# Patient Record
Sex: Female | Born: 1975
Health system: Southern US, Community
[De-identification: ages and names within clinical notes are randomized; demographics above are authoritative.]

## PROBLEM LIST (undated history)

## (undated) DIAGNOSIS — G43909 Migraine, unspecified, not intractable, without status migrainosus: Secondary | ICD-10-CM

## (undated) DIAGNOSIS — R011 Cardiac murmur, unspecified: Secondary | ICD-10-CM

## (undated) DIAGNOSIS — J386 Stenosis of larynx: Secondary | ICD-10-CM

## (undated) DIAGNOSIS — C801 Malignant (primary) neoplasm, unspecified: Secondary | ICD-10-CM

## (undated) DIAGNOSIS — B019 Varicella without complication: Secondary | ICD-10-CM

## (undated) DIAGNOSIS — I8390 Asymptomatic varicose veins of unspecified lower extremity: Secondary | ICD-10-CM

## (undated) HISTORY — DX: Varicella without complication: B01.9

## (undated) HISTORY — DX: Malignant (primary) neoplasm, unspecified: C80.1

## (undated) HISTORY — DX: Migraine, unspecified, not intractable, without status migrainosus: G43.909

## (undated) HISTORY — DX: Stenosis of larynx: J38.6

## (undated) HISTORY — DX: Cardiac murmur, unspecified: R01.1

## (undated) HISTORY — PX: WISDOM TOOTH EXTRACTION: SHX21

## (undated) HISTORY — DX: Asymptomatic varicose veins of unspecified lower extremity: I83.90

---

## 1998-08-27 ENCOUNTER — Emergency Department (HOSPITAL_COMMUNITY): Admission: EM | Admit: 1998-08-27 | Discharge: 1998-08-27 | Payer: Self-pay | Admitting: Emergency Medicine

## 2004-03-11 ENCOUNTER — Other Ambulatory Visit: Admission: RE | Admit: 2004-03-11 | Discharge: 2004-03-11 | Payer: Self-pay | Admitting: Obstetrics & Gynecology

## 2005-04-21 ENCOUNTER — Other Ambulatory Visit: Admission: RE | Admit: 2005-04-21 | Discharge: 2005-04-21 | Payer: Self-pay | Admitting: Obstetrics & Gynecology

## 2012-12-31 ENCOUNTER — Ambulatory Visit: Payer: Self-pay | Admitting: Family Medicine

## 2013-01-04 ENCOUNTER — Ambulatory Visit (INDEPENDENT_AMBULATORY_CARE_PROVIDER_SITE_OTHER): Payer: Managed Care, Other (non HMO) | Admitting: Family Medicine

## 2013-01-04 ENCOUNTER — Telehealth: Payer: Self-pay | Admitting: Family Medicine

## 2013-01-04 ENCOUNTER — Encounter: Payer: Self-pay | Admitting: Family Medicine

## 2013-01-04 VITALS — BP 102/78 | HR 77 | Temp 98.5°F | Wt 212.0 lb

## 2013-01-04 DIAGNOSIS — Z23 Encounter for immunization: Secondary | ICD-10-CM

## 2013-01-04 DIAGNOSIS — N946 Dysmenorrhea, unspecified: Secondary | ICD-10-CM | POA: Insufficient documentation

## 2013-01-04 DIAGNOSIS — R011 Cardiac murmur, unspecified: Secondary | ICD-10-CM

## 2013-01-04 DIAGNOSIS — Z7189 Other specified counseling: Secondary | ICD-10-CM

## 2013-01-04 DIAGNOSIS — Z7689 Persons encountering health services in other specified circumstances: Secondary | ICD-10-CM

## 2013-01-04 LAB — LIPID PANEL
LDL Cholesterol: 64 mg/dL (ref 0–99)
Total CHOL/HDL Ratio: 3
VLDL: 9.2 mg/dL (ref 0.0–40.0)

## 2013-01-04 LAB — HEMOGLOBIN A1C: Hgb A1c MFr Bld: 5.3 % (ref 4.6–6.5)

## 2013-01-04 NOTE — Telephone Encounter (Signed)
Labs look good.

## 2013-01-04 NOTE — Addendum Note (Signed)
Addended by: Azucena Freed on: 01/04/2013 08:42 AM   Modules accepted: Orders

## 2013-01-04 NOTE — Progress Notes (Signed)
Chief Complaint  Patient presents with  . Establish Care    HPI:  Kayla Mccall is here to establish care. Has not had a family doctor in past - husband comes to this practice.  Last PCP and physical: sees doctor Arlyce Dice in gyn for physicals and pap smears - last physical 08/2012  Has the following chronic problems and concerns today:  Patient Active Problem List  Diagnosis  . Menstrual cramps - uses vicodin very sparingly once per month  . Murmur    Health Maintenance: -does not get flu vaccines, can't remember last tetanus  ROS: See pertinent positives and negatives per HPI. Denies CP, SOB, DOE, palpitations, GI changes, urinary symptoms, skin changes. Has pain in L lateral leg occ when walking. Has occ chest tightness when exercising - thinks just deconditioned.  Past Medical History  Diagnosis Date  . Chicken pox   . Migraines     Family History  Problem Relation Age of Onset  . Hyperlipidemia Mother   . Arthritis Maternal Grandmother   . Breast cancer      maternal great grandmother  . Diabetes Maternal Grandmother     History   Social History  . Marital Status: Unknown    Spouse Name: N/A    Number of Children: N/A  . Years of Education: N/A   Social History Main Topics  . Smoking status: Never Smoker   . Smokeless tobacco: None  . Alcohol Use: Yes     Comment: occ - 1-2 drinks rarely  . Drug Use: No  . Sexually Active: Yes     Comment: with spouse   Other Topics Concern  . 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 Only during menstrual cycle., Disp: , Rfl:   EXAM:  Filed Vitals:   01/04/13 0802  BP: 102/78  Pulse: 77  Temp: 98.5 F (36.9 C)    There is no height on file to calculate BMI.  GENERAL: vitals reviewed and listed above, alert, oriented, appears well hydrated and in no acute distress  HEENT: atraumatic, conjunttiva clear, no obvious abnormalities on inspection of external nose and ears  NECK: no obvious masses on inspection  LUNGS: clear to auscultation bilaterally, no wheezes, rales or rhonchi, good air movement  CV: HRRR, SEM, no peripheral edema  MS: moves all extremities without noticeable abnormality  -L foot/ankle with mild varus deformity  PSYCH: pleasant and cooperative, no obvious depression or anxiety  ASSESSMENT AND PLAN:  Discussed the following assessment and plan:  Encounter to establish care - Plan: Lipid Panel, Hemoglobin A1C  Menstrual cramps - uses vicodin very sparingly once per month  Murmur - Plan: 2D Echocardiogram without contrast  -We reviewed the PMH, PSH, FH, SH, Meds and Allergies. -murmur on exam pt reports not present in the past - sounds benign but pt worried -  will get echo -FASTING LABS today -advised shoes with reduced arch for foot and leg issues  -tdap today -follow up in 3 months or sooner pending labs/studies  -Patient advised to return or notify a doctor immediately if symptoms worsen or persist or new concerns arise.  Patient Instructions  -We have ordered labs or studies at this visit. It can take up to 1-2 weeks for results and processing. We will contact you with instructions IF your results are abnormal. Normal results will be released to your [facility]. If you have not heard from [place] or can not find your results in [facility] in 2 weeks please contact our office.  -PLEASE SIGN UP FOR MYCHART TODAY    -We placed a referral for you as discussed to get an ECHOCARDIOGRAM of your heart. It usually takes about 1-2 weeks to process and schedule this referral. If you have not heard from [place] regarding this appointment in 2 weeks please contact our office.  We recommend the following healthy lifestyle measures: - eat a healthy diet consisting of lots of vegetables, fruits, beans, nuts, seeds, healthy meats such as white chicken and fish and whole grains.  - avoid fried foods, fast food, processed foods, sodas, red meet and other fattening foods.  - get a least 150 minutes of aerobic exercise per week.   Follow up in: 3 months      KIM, HANNAH R.

## 2013-01-04 NOTE — Patient Instructions (Addendum)
-  We have ordered labs or studies at this visit. It can take up to 1-2 weeks for results and processing. We will contact you with instructions IF your results are abnormal. Normal results will be released to your [facility]. If you have not heard from [place] or can not find your results in [facility] in 2 weeks please contact our office.  -PLEASE SIGN UP FOR MYCHART TODAY   -We placed a referral for you as discussed to get an ECHOCARDIOGRAM of your heart. It usually takes about 1-2 weeks to process and schedule this referral. If you have not heard from [place] regarding this appointment in 2 weeks please contact our office.  We recommend the following healthy lifestyle measures: - eat a healthy diet consisting of lots of vegetables, fruits, beans, nuts, seeds, healthy meats such as white chicken and fish and whole grains.  - avoid fried foods, fast food, processed foods, sodas, red meet and other fattening foods.  - get a least 150 minutes of aerobic exercise per week.   Follow up in: 3 months

## 2013-01-05 NOTE — Telephone Encounter (Signed)
Called and spoke with pt and pt is aware.  

## 2013-01-12 ENCOUNTER — Ambulatory Visit (HOSPITAL_COMMUNITY): Payer: Managed Care, Other (non HMO) | Attending: Family Medicine | Admitting: Radiology

## 2013-01-12 VITALS — Ht 63.0 in

## 2013-01-12 DIAGNOSIS — R011 Cardiac murmur, unspecified: Secondary | ICD-10-CM | POA: Insufficient documentation

## 2013-01-12 NOTE — Progress Notes (Signed)
Echocardiogram performed.  

## 2013-01-13 ENCOUNTER — Telehealth: Payer: Self-pay | Admitting: Family Medicine

## 2013-01-13 NOTE — Telephone Encounter (Signed)
Please let her know the echo of her heart was normal.

## 2013-01-15 NOTE — Telephone Encounter (Signed)
Called and spoke with pt and pt is aware.  

## 2013-01-17 ENCOUNTER — Encounter: Payer: Self-pay | Admitting: Family Medicine

## 2013-01-17 NOTE — Progress Notes (Signed)
Received a few OV notes and pap report from Dr. Arlyce Dice in gyn. Pap for 06/2010. Gyn refilling vicodin for menstrual cramps.

## 2013-02-15 ENCOUNTER — Encounter: Payer: Self-pay | Admitting: Family Medicine

## 2013-02-15 ENCOUNTER — Ambulatory Visit (INDEPENDENT_AMBULATORY_CARE_PROVIDER_SITE_OTHER): Payer: Managed Care, Other (non HMO) | Admitting: Family Medicine

## 2013-02-15 VITALS — BP 124/80 | Temp 99.0°F | Wt 206.0 lb

## 2013-02-15 DIAGNOSIS — J069 Acute upper respiratory infection, unspecified: Secondary | ICD-10-CM

## 2013-02-15 MED ORDER — AMOXICILLIN 875 MG PO TABS: 875.0000 mg | ORAL_TABLET | Freq: Two times a day (BID) | ORAL | Status: DC

## 2013-02-15 NOTE — Progress Notes (Signed)
Chief Complaint  Patient presents with  . Cough    sore throat, chest congestion, ears stopped up, facial pain x 1 week     HPI:  Sinus issues: -started about 1 week -symptoms: scratchy throat, nasal congestion, ears full, facial pain, cough - thought was getting better but then got worse today, chills -denies: fevers, SOB, NVD, tooth pain -sick contacts: nephew was sick -has tried: afrin, musinex  ROS: See pertinent positives and negatives per HPI.  Past Medical History  Diagnosis Date  . Chicken pox   . Migraines     Family History  Problem Relation Age of Onset  . Hyperlipidemia Mother   . Arthritis Maternal Grandmother   . Breast cancer      maternal great grandmother  . Diabetes Maternal Grandmother     History   Social History  . Marital Status: Unknown    Spouse Name: N/A    Number of Children: N/A  . Years of Education: N/A   Social History Main Topics  . Smoking status: Never Smoker   . Smokeless tobacco: None  . Alcohol Use: Yes     Comment: occ - 1-2 drinks rarely  . Drug Use: No  . Sexually Active: Yes     Comment: with spouse   Other Topics Concern  . 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 Only during menstrual cycle., Disp: , Rfl: ;  amoxicillin (AMOXIL) 875 MG tablet, Take 1 tablet (875 mg total) by mouth 2 (two) times daily., Disp: 20 tablet, Rfl: 0  EXAM:  Filed Vitals:   02/15/13  1003  BP: 124/80  Temp: 99 F (37.2 C)    Body mass index is 36.5 kg/(m^2).  GENERAL: vitals reviewed and listed above, alert, oriented, appears well hydrated and in no acute distress  HEENT: atraumatic, conjunttiva clear, no obvious abnormalities on inspection of external nose and ears, normal appearance of ear canals and TMs, clear nasal congestion, mild post oropharyngeal erythema with PND, no tonsillar edema or exudate, no sinus TTP  NECK: no obvious masses on inspection  LUNGS: clear to auscultation bilaterally, no wheezes, rales or rhonchi, good air movement  CV: HRRR, no peripheral edema  MS: moves all extremities without noticeable abnormality  PSYCH: pleasant and cooperative, no obvious depression or anxiety  ASSESSMENT AND PLAN:  Discussed the following assessment and plan:  Upper respiratory infection - Plan: amoxicillin (AMOXIL) 875 MG tablet  -likely viral - advised supportive care, abx given if worsening or not better in a week - risks discussed, she will shred if does not use -Patient advised to return or notify a doctor immediately if symptoms worsen or persist or new concerns arise.  Patient Instructions  INSTRUCTIONS FOR UPPER RESPIRATORY INFECTION:  -plenty of rest and fluids  -nasal saline wash 2-3 times daily (use prepackaged nasal saline or bottled/distilled  water if making your own)   -clean nose with nasal saline before using the nasal steroid or sinex  -can use sinex nasal spray for drainage and nasal congestion - but do NOT use longer then 3-4 days  -can use tylenol or ibuprofen as directed for aches and sorethroat  -in the winter time, using a humidifier at night is helpful (please follow cleaning instructions)  -if you are taking a cough medication - use only as directed, may also try a teaspoon of honey to coat the throat and throat lozenges  -for sore throat, salt water gargles can help  -follow up if you have fevers, facial pain, tooth  pain, difficulty breathing or are worsening or not getting better in 5-7 days      KIM, HANNAH R.

## 2013-02-15 NOTE — Patient Instructions (Signed)

## 2013-09-15 ENCOUNTER — Other Ambulatory Visit: Payer: Self-pay

## 2013-09-16 ENCOUNTER — Encounter: Payer: Self-pay | Admitting: Family

## 2013-09-16 ENCOUNTER — Ambulatory Visit (INDEPENDENT_AMBULATORY_CARE_PROVIDER_SITE_OTHER): Payer: Managed Care, Other (non HMO) | Admitting: Family

## 2013-09-16 VITALS — BP 112/68 | HR 66 | Wt 206.0 lb

## 2013-09-16 DIAGNOSIS — M549 Dorsalgia, unspecified: Secondary | ICD-10-CM

## 2013-09-16 DIAGNOSIS — M546 Pain in thoracic spine: Secondary | ICD-10-CM

## 2013-09-16 MED ORDER — CYCLOBENZAPRINE HCL 10 MG PO TABS: 10.0000 mg | ORAL_TABLET | Freq: Three times a day (TID) | ORAL | Status: DC | PRN

## 2013-09-16 NOTE — Patient Instructions (Signed)
Muscle Strain  Muscle strain occurs when a muscle is stretched beyond its normal length. A small number of muscle fibers generally are torn. This is especially common in athletes. This happens when a sudden, violent force placed on a muscle stretches it too far. Usually, recovery from muscle strain takes 1 to 2 weeks. Complete healing will take 5 to 6 weeks.   HOME CARE INSTRUCTIONS    While awake, apply ice to the sore muscle for the first 2 days after the injury.   Put ice in a plastic bag.   Place a towel between your skin and the bag.   Leave the ice on for 15-20 minutes each hour.   Do not use the strained muscle for several days, until you no longer have pain.   You may wrap the injured area with an elastic bandage for comfort. Be careful not to wrap it too tightly. This may interfere with blood circulation or increase swelling.   Only take over-the-counter or prescription medicines for pain, discomfort, or fever as directed by your caregiver.  SEEK MEDICAL CARE IF:   You have increasing pain or swelling in the injured area.  MAKE SURE YOU:    Understand these instructions.   Will watch your condition.   Will get help right away if you are not doing well or get worse.  Document Released: 10/27/2005 Document Revised: 01/19/2012 Document Reviewed: 11/08/2011  Exit[employer] Patient Information 2014 [employer]

## 2013-09-16 NOTE — Progress Notes (Signed)
Subjective:    Patient ID: Kayla Mccall, female    DOB: 05/10/1976, 37 y.o.   MRN: [id number]  HPI 37 year old white female, nonsmoker, patient of Dr. Selena Batten is in today with left upper back pain x2 months. The pain is especially worse when turning her head to the right. She denies any injury. Rates the pain a 6/10. She was taking Aleve periodically and seeing a chiropractor.  She seen a chiropractor approximately 6-7 times without any relief. She sits at a computer all day at work.    Review of Systems  Constitutional: Negative.   HENT: Negative.   Respiratory: Negative.   Cardiovascular: Negative.   Gastrointestinal: Negative.   Genitourinary: Negative.   Musculoskeletal: Positive for back pain.       Left upper back pain  Skin: Negative.   Allergic/Immunologic: Negative.   Neurological: Negative.   Psychiatric/Behavioral: Negative.    Past Medical History  Diagnosis Date  . Chicken pox   . Migraines     History   Social History  . Marital Status: Unknown    Spouse Name: N/A    Number of Children: N/A  . Years of Education: N/A   Occupational History  . Not on file.   Social History Main Topics  . Smoking status: Never Smoker   . Smokeless tobacco: Not on file  . Alcohol Use: Yes     Comment: occ - 1-2 drinks rarely  . Drug Use: No  . Sexual Activity: Yes     Comment: with spouse   Other Topics Concern  . 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 No pertinent past surgical history.  Family History  Problem Relation Age of Onset  . Hyperlipidemia Mother   . Arthritis Maternal Grandmother   . Breast cancer      maternal great  grandmother  . Diabetes Maternal Grandmother     No Known Allergies  Current Outpatient Prescriptions on File Prior to Visit  Medication Sig Dispense Refill  . flintstones complete (FLINTSTONES) 60 MG chewable tablet Chew 1 tablet by mouth daily.      Marland Kitchen amoxicillin (AMOXIL) 875 MG tablet Take 1 tablet (875 mg total) by mouth 2 (two) times daily.  20 tablet  0  . HYDROcodone-acetaminophen (NORCO/VICODIN) 5-325 MG per tablet Take 1 tablet by mouth every 6 (six) hours as needed for pain. Only during menstrual cycle.       No current facility-administered medications on file prior to visit.    BP 112/68  Pulse 66  Wt 206 lb (93.441 kg)chart    Objective:   Physical Exam  Constitutional: She is oriented to person, place, and time. She appears well-developed and well-nourished.  Neck: Normal range of motion. Neck supple.  Cardiovascular: Normal rate, regular rhythm and normal heart sounds.   Pulmonary/Chest: Effort normal and breath sounds normal.  Abdominal: Soft. Bowel sounds are normal.  Musculoskeletal: She exhibits tenderness.  Left upper back when turning the neck to the right. Otherwise no pain elicited.   Neurological: She is alert and oriented  to person, place, and time.  Skin: Skin is warm and dry.  Psychiatric: She has a normal mood and affect.          Assessment & Plan:  Assessment:  1. Muscle Strain  2. Left Upper Back Pain  Plan: Flexeril 10mg  three times. Warned of drowsiness. Advised a massage. Call if no better in 7-10 days.

## 2013-09-16 NOTE — Progress Notes (Signed)
Pre-visit discussion using our clinic review tool. No additional management support is needed unless otherwise documented below in the visit note.  

## 2013-09-22 ENCOUNTER — Other Ambulatory Visit: Payer: Self-pay | Admitting: Family

## 2013-09-22 MED ORDER — METHYLPREDNISOLONE 4 MG PO KIT: PACK | ORAL | Status: AC

## 2013-11-17 ENCOUNTER — Ambulatory Visit (INDEPENDENT_AMBULATORY_CARE_PROVIDER_SITE_OTHER): Payer: Managed Care, Other (non HMO) | Admitting: Family Medicine

## 2013-11-17 ENCOUNTER — Ambulatory Visit (INDEPENDENT_AMBULATORY_CARE_PROVIDER_SITE_OTHER)
Admission: RE | Admit: 2013-11-17 | Discharge: 2013-11-17 | Disposition: A | Discharge status: Home / Self Care | Payer: Managed Care, Other (non HMO) | Source: Ambulatory Visit | Attending: Family Medicine | Admitting: Family Medicine

## 2013-11-17 ENCOUNTER — Encounter: Payer: Self-pay | Admitting: Family Medicine

## 2013-11-17 VITALS — BP 110/74 | Temp 98.9°F | Wt 206.0 lb

## 2013-11-17 DIAGNOSIS — M549 Dorsalgia, unspecified: Secondary | ICD-10-CM

## 2013-11-17 NOTE — Progress Notes (Signed)
Pre visit review using our clinic review tool, if applicable. No additional management support is needed unless otherwise documented below in the visit note. 

## 2013-11-17 NOTE — Progress Notes (Signed)
Chief Complaint  Patient presents with  . follow up back pain    HPI:  Acute visit for:  Back Pain: -saw Roxy Cedar 09/2013 and complained of L upper back pain thought to be muscle strain and tx with flexeril -was seeing chiropractor -reports worse with flexing head, better with rest and after sleeping and with aleve -does not feel it all the time, but sharp pain with certain movements and worse during the day -feels pain with it every day, not getting worse -denies: fevers, chills, malaise, weight loss unintentional, SOB, cough  ROS: See pertinent positives and negatives per HPI.  Past Medical History  Diagnosis Date  . Chicken pox   . Migraines     No past surgical history on file.  Family History  Problem Relation Age of Onset  . Hyperlipidemia Mother   . Arthritis Maternal Grandmother   . Breast cancer      maternal great grandmother  . Diabetes Maternal Grandmother     History   Social History  . Marital Status: Unknown    Spouse Name: N/A    Number of Children: N/A  . Years of Education: N/A   Social History Main Topics  . Smoking status: Never Smoker   . Smokeless tobacco: None  . Alcohol Use: Yes     Comment: occ - 1-2 drinks rarely  . Drug Use: No  . Sexual Activity: Yes     Comment: with spouse   Other Topics Concern  . 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  GENERAL: vitals reviewed and listed  above, alert, oriented, appears well hydrated and in no acute distress  HEENT: atraumatic, conjunttiva clear, no obvious abnormalities on inspection of external nose and ears  NECK: no obvious masses on inspection  MS: moves all extremities without noticeable abnormality -TTP L upper rhomboids, para spinal thoracic muscles, normal ROM in thoracic spine without bony tenderness or obvious scoliosis  PSYCH: pleasant and cooperative, no obvious depression or anxiety  ASSESSMENT AND PLAN:  Discussed the following assessment and plan:  Backache - Plan: DG Thoracic Spine 2 View  -likely muscular, but given persistent we discussed possible serious and likely etiologies, workup (including imaging here versus seeing a specialist in sports med or PMR) and treatment, treatment risks and return precautions -after this discussion, [place] opted for plain films, then likely will proceed with MRI -of course, we advised Roselyn  to return or notify a doctor immediately if symptoms worsen or persist or new concerns arise.  -Patient advised to return or notify a doctor immediately if symptoms worsen or persist or new concerns arise.  There are no Patient Instructions on file for this visit.   Colin Benton R.

## 2013-11-21 ENCOUNTER — Other Ambulatory Visit: Payer: Self-pay

## 2013-11-21 DIAGNOSIS — M549 Dorsalgia, unspecified: Secondary | ICD-10-CM

## 2013-11-25 ENCOUNTER — Telehealth: Payer: Self-pay | Admitting: Family Medicine

## 2013-11-25 NOTE — Telephone Encounter (Signed)
Pt would like a callback to talk about her back pain.

## 2013-11-25 NOTE — Telephone Encounter (Signed)
Called and spoke with pt and pt states she has not heard from the referral for back pain.  Called and spoke with pt and pt is aware of results and referral.  Advised that referral had been sent and they should contact pt in the next week or two to get an appointment set up.  Pt is aware.

## 2013-12-06 ENCOUNTER — Encounter: Payer: Self-pay | Admitting: Physical Medicine & Rehabilitation

## 2013-12-07 ENCOUNTER — Telehealth: Payer: Self-pay | Admitting: Family Medicine

## 2013-12-07 NOTE — Telephone Encounter (Signed)
Pt can not see dr Read Drivers until 01-13-14. Pt would like to see another pain management md sooner.

## 2013-12-07 NOTE — Telephone Encounter (Signed)
Called pt to make aware that per the referral coordinator it takes a few months for pt to get in with pain management.  Pt verbalized understanding.

## 2013-12-26 ENCOUNTER — Other Ambulatory Visit: Payer: Self-pay | Admitting: Surgery

## 2013-12-26 DIAGNOSIS — I83893 Varicose veins of bilateral lower extremities with other complications: Secondary | ICD-10-CM

## 2013-12-27 ENCOUNTER — Ambulatory Visit: Payer: Managed Care, Other (non HMO) | Admitting: Physical Medicine & Rehabilitation

## 2014-01-06 ENCOUNTER — Encounter: Payer: Self-pay | Admitting: Surgery

## 2014-01-09 ENCOUNTER — Encounter: Payer: Self-pay | Admitting: Surgery

## 2014-01-09 ENCOUNTER — Ambulatory Visit (INDEPENDENT_AMBULATORY_CARE_PROVIDER_SITE_OTHER): Payer: Managed Care, Other (non HMO) | Admitting: Surgery

## 2014-01-09 ENCOUNTER — Ambulatory Visit (HOSPITAL_COMMUNITY)
Admission: RE | Admit: 2014-01-09 | Discharge: 2014-01-09 | Disposition: A | Discharge status: Home / Self Care | Payer: Managed Care, Other (non HMO) | Source: Ambulatory Visit | Attending: Surgery | Admitting: Surgery

## 2014-01-09 VITALS — BP 127/77 | HR 69 | Ht 63.0 in | Wt 205.0 lb

## 2014-01-09 DIAGNOSIS — I83893 Varicose veins of bilateral lower extremities with other complications: Secondary | ICD-10-CM | POA: Insufficient documentation

## 2014-01-09 NOTE — Progress Notes (Signed)
Patient ID: Kayla Mccall, female   DOB: [birth year], 38 y.o.   MRN: [id number]     Patient name: Kayla Mccall MRN: [id number] DOB: 1976/03/23 Sex: female   Referred by: Dr. Maudie Mercury  Reason for referral:  Chief Complaint  Patient presents with  . Varicose Veins    vv's bilateral w/ pain     HISTORY OF PRESENT ILLNESS: This is a very pleasant 38 year old female who was referred today for leg pain.  She states that for the past year she has been getting tightness in her left calf.  This comes on with walking and is alleviated with rest.  She also is in a sitting position most of the day and will experience bilateral thigh shooting pains that sometimes go into the right calf.  The patient denies a history of DVT or major trauma.  She reports A. intermittent larger vein in her left lower calf.  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  Past Medical History  Diagnosis Date  . Chicken pox   . Migraines   . Cancer     melanoma R LE  . Heart murmur   . Varicose veins     History reviewed. No pertinent past surgical history.  History   Social History  . Marital Status: Unknown    Spouse Name: N/A    Number of Children: N/A  . Years of Education: N/A   Occupational History  . Not on file.   Social History Main Topics  . Smoking status: Never Smoker   . Smokeless tobacco: Never Used  . Alcohol Use: Yes     Comment: occ - 1-2 drinks rarely  . Drug Use: No  . Sexual Activity: Yes     Comment: with spouse   Other Topics Concern  . 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 Hyperlipidemia Mother   . Arthritis Maternal Grandmother   . Breast cancer      maternal great grandmother  . Diabetes Maternal Grandmother     Allergies as of 01/09/2014  . (No Known Allergies)    Current Outpatient Prescriptions on File Prior to Visit  Medication Sig Dispense Refill  . Multiple Vitamins-Minerals (ALIVE WOMENS ENERGY PO) Take by mouth daily.       No current facility-administered medications on file prior to visit.     REVIEW OF SYSTEMS: Cardiovascular: No chest pain, chest pressure, palpitations, orthopnea, or dyspnea on exertion. No claudication or rest pain,  No history of DVT or phlebitis.  See history of present illness Pulmonary: No productive cough, asthma or wheezing. Neurologic: No weakness, paresthesias, aphasia, or amaurosis. No dizziness. Hematologic: No bleeding problems or clotting disorders. Musculoskeletal: No joint pain or joint swelling. Gastrointestinal: No blood in stool or hematemesis Genitourinary: No dysuria or hematuria. Psychiatric:: No history of major depression. Integumentary: No rashes or ulcers. Constitutional: No fever or chills.  PHYSICAL EXAMINATION: General: The patient appears their stated age.  Vital signs  are BP 127/77  Pulse 69  Ht 5\' 3"  (1.6 m)  Wt 205 lb (92.987 kg)  BMI 36.32 kg/m2  SpO2 100% HEENT:  No gross abnormalities Pulmonary: Respirations are non-labored Musculoskeletal: There are no major deformities.   Neurologic: No focal weakness or paresthesias are detected, Skin: There are no ulcer or rashes noted. Psychiatric: The patient has normal affect. Cardiovascular: There is a regular rate and rhythm without significant murmur appreciated.  Palpable dorsalis pedis pulse bilaterally.  Minimal edema.  Spider veins on the left calf and  right anterior thigh  Diagnostic Studies: Venous reflux studies were performed today.  This was positive for common femoral vein reflux and negative for DVT.  There  was no evidence of saphenous vein reflux   Assessment:  Bilateral leg pain, left greater than right Plan: I discussed with the patient that I do not feel that her symptoms are related to an arterial or venous pathology.  She has mild reflux in bilateral common femoral veins, and she does report occasional edema.  I have recommended a trial of 20-30 mmHg compression stockings to see if this helps alleviate some of her symptoms.  I also told her that with shooting pains in her bilateral thighs, I would consider a neuropathic origin potentially lower back pain.  She doesn't endorse upper back pain.  She may benefit from some form of exercise/stretching treatment for this.  She will contact me if she has any further questions.     Eldridge Abrahams, M.D. [facility] Office: [phone] Pager:  [phone]

## 2014-01-13 ENCOUNTER — Ambulatory Visit: Payer: Managed Care, Other (non HMO) | Admitting: Physical Medicine & Rehabilitation

## 2014-01-30 ENCOUNTER — Ambulatory Visit (HOSPITAL_BASED_OUTPATIENT_CLINIC_OR_DEPARTMENT_OTHER): Payer: Managed Care, Other (non HMO) | Admitting: Physical Medicine & Rehabilitation

## 2014-01-30 ENCOUNTER — Encounter: Payer: Managed Care, Other (non HMO) | Attending: Physical Medicine & Rehabilitation

## 2014-01-30 ENCOUNTER — Encounter: Payer: Self-pay | Admitting: Physical Medicine & Rehabilitation

## 2014-01-30 VITALS — BP 121/72 | HR 73 | Resp 14 | Ht 65.0 in | Wt 207.0 lb

## 2014-01-30 DIAGNOSIS — M549 Dorsalgia, unspecified: Secondary | ICD-10-CM

## 2014-01-30 DIAGNOSIS — M542 Cervicalgia: Secondary | ICD-10-CM | POA: Insufficient documentation

## 2014-01-30 DIAGNOSIS — Z85820 Personal history of malignant melanoma of skin: Secondary | ICD-10-CM | POA: Insufficient documentation

## 2014-01-30 DIAGNOSIS — G8929 Other chronic pain: Secondary | ICD-10-CM

## 2014-01-30 NOTE — Patient Instructions (Signed)
Recommend heat for neck pain rather than ice

## 2014-01-30 NOTE — Progress Notes (Signed)
Subjective:    Patient ID: Kayla Mccall, female    DOB: 1975/12/16, 38 y.o.   MRN: [id number]  HPI Chief complaint is upper back pain Onset August 2014 no history of trauma. Pain worsens with flexion of the head as well as turning. Evaluated by chiropractor and had some adjustments on her neck but these were not helpful. Evaluated by primary care physician. Thoracic spine film demonstrating mild levoscoliosis T9. No physical therapy thus far. Pain is alleviated by Aleve 3 tablets. Takes this a couple times a week rather than every day.  Pain Inventory Average Pain 5 Pain Right Now 5 My pain is sharp  In the last 24 hours, has pain interfered with the following? General activity 4 Relation with others 4 Enjoyment of life 4 What TIME of day is your pain at its worst? evening Sleep (in general) Fair  Pain is worse with: n/a Pain improves with: medication Relief from Meds: 8  Mobility walk without assistance  Function employed # of hrs/week 40  Neuro/Psych No problems in this area  Prior Studies Any changes since last visit?  no  Physicians involved in your care Any changes since last visit?  no   Family History  Problem Relation Age of Onset  . Hyperlipidemia Mother   . Arthritis Maternal Grandmother   . Diabetes Maternal Grandmother   . Breast cancer      maternal great grandmother   History   Social History  . Marital Status: Unknown    Spouse Name: N/A    Number of Children: N/A  . Years of Education: N/A   Social History Main Topics  . Smoking status: Never Smoker   . Smokeless tobacco: Never Used  . Alcohol Use: Yes     Comment: occ - 1-2 drinks rarely  . Drug Use: No  . Sexual Activity: Yes     Comment: with spouse   Other Topics Concern  . None   Social History Narrative   Work or School: Engineer, petroleum Situation: lives with spouse      Spiritual Beliefs: none      Lifestyle: walking several times per week for 30  minutes, working on diet - cutting back on portion sizes, also replaced lunch with meal bar and shake and has been losing some weight            Past Surgical History  Procedure Laterality Date  . Wisdom tooth extraction     Past Medical History  Diagnosis Date  . Chicken pox   . Migraines   . Cancer     melanoma R LE  . Heart murmur   . Varicose veins    BP 121/72  Pulse 73  Resp 14  Ht 5\' 5"  (1.651 m)  Wt 207 lb (93.895 kg)  BMI 34.45 kg/m2  SpO2 100%  LMP 01/18/2014  Opioid Risk Score: 1 Fall Risk Score: Low Fall Risk (0-5 points) (patient educated handout declined)   Review of Systems  Musculoskeletal: Positive for back pain and neck pain.  All other systems reviewed and are negative.       Objective:   Physical Exam  Nursing note and vitals reviewed. Constitutional: She appears well-developed and well-nourished.  HENT:  Head: Normocephalic and atraumatic.  Eyes: Conjunctivae are normal. Pupils are equal, round, and reactive to light.  Neck: Normal range of motion.  Psychiatric: She has a normal mood and affect.   Cervical range of motion  is normal There is pain around T5/T6 area with flexion of the cervical spine as well as rotation worse on the left than on the right. There is good alignment, no obvious scoliosis on examination. Thoracic spine range of motion is normal Lumbar spine range of motion is normal No tenderness to palpation in the cervical or thoracic paraspinal muscles. Normal shoulder range of motion        Assessment & Plan:  1. Cervicalgia this appears to be related to prolonged computer use with poor posture. No signs of any red flags. No signs of radiculopathy. Her pain is exacerbated with forward flexion and turning her head. We'll check x-rays to evaluate degree of underlying degenerative changes. Will make referral to physical therapy. Do not think narcotic analgesics will be needed in this case and patient would like to avoid  these as well. Advise heat rather than ice

## 2014-02-02 ENCOUNTER — Telehealth: Payer: Self-pay

## 2014-02-02 NOTE — Telephone Encounter (Signed)
Patient called to see where she needed to go to get X-Ray on her neck. Patient was given the order at her last OV. Contacted patient to inform her to go to [place] for the X-Rays.

## 2014-02-03 ENCOUNTER — Ambulatory Visit
Admission: RE | Admit: 2014-02-03 | Discharge: 2014-02-03 | Disposition: A | Discharge status: Home / Self Care | Payer: Managed Care, Other (non HMO) | Source: Ambulatory Visit | Attending: Physical Medicine & Rehabilitation | Admitting: Physical Medicine & Rehabilitation

## 2014-02-03 DIAGNOSIS — M542 Cervicalgia: Secondary | ICD-10-CM

## 2014-02-09 ENCOUNTER — Telehealth: Payer: Self-pay | Admitting: *Deleted

## 2014-02-09 ENCOUNTER — Ambulatory Visit: Payer: Managed Care, Other (non HMO) | Attending: Physical Medicine & Rehabilitation | Admitting: Physical Therapy

## 2014-02-09 DIAGNOSIS — M25519 Pain in unspecified shoulder: Secondary | ICD-10-CM | POA: Insufficient documentation

## 2014-02-09 DIAGNOSIS — M542 Cervicalgia: Secondary | ICD-10-CM | POA: Insufficient documentation

## 2014-02-09 DIAGNOSIS — IMO0001 Reserved for inherently not codable concepts without codable children: Secondary | ICD-10-CM | POA: Insufficient documentation

## 2014-02-09 NOTE — Telephone Encounter (Signed)
Pt stated that she would call back to schedule her appts due to she needs to look at her calendar....td

## 2014-02-13 ENCOUNTER — Ambulatory Visit: Payer: Managed Care, Other (non HMO) | Admitting: Rehabilitation

## 2014-02-13 ENCOUNTER — Telehealth: Payer: Self-pay

## 2014-02-13 NOTE — Telephone Encounter (Signed)
Normal neck Xray, suspect muscular/postural problems causing pain

## 2014-02-13 NOTE — Telephone Encounter (Signed)
Patient called requesting xray results.  Please advise.

## 2014-02-14 ENCOUNTER — Encounter (HOSPITAL_COMMUNITY): Payer: Self-pay | Admitting: Emergency Medicine

## 2014-02-14 ENCOUNTER — Emergency Department (HOSPITAL_COMMUNITY)
Admission: EM | Admit: 2014-02-14 | Discharge: 2014-02-15 | Disposition: A | Discharge status: Home / Self Care | Payer: Managed Care, Other (non HMO) | Attending: Emergency Medicine | Admitting: Emergency Medicine

## 2014-02-14 DIAGNOSIS — S0100XA Unspecified open wound of scalp, initial encounter: Secondary | ICD-10-CM | POA: Insufficient documentation

## 2014-02-14 DIAGNOSIS — W2209XA Striking against other stationary object, initial encounter: Secondary | ICD-10-CM | POA: Insufficient documentation

## 2014-02-14 DIAGNOSIS — Y929 Unspecified place or not applicable: Secondary | ICD-10-CM | POA: Insufficient documentation

## 2014-02-14 DIAGNOSIS — Z8619 Personal history of other infectious and parasitic diseases: Secondary | ICD-10-CM | POA: Insufficient documentation

## 2014-02-14 DIAGNOSIS — Z85828 Personal history of other malignant neoplasm of skin: Secondary | ICD-10-CM | POA: Insufficient documentation

## 2014-02-14 DIAGNOSIS — S0101XA Laceration without foreign body of scalp, initial encounter: Secondary | ICD-10-CM

## 2014-02-14 DIAGNOSIS — Z8679 Personal history of other diseases of the circulatory system: Secondary | ICD-10-CM | POA: Insufficient documentation

## 2014-02-14 DIAGNOSIS — R011 Cardiac murmur, unspecified: Secondary | ICD-10-CM | POA: Insufficient documentation

## 2014-02-14 DIAGNOSIS — Y9389 Activity, other specified: Secondary | ICD-10-CM | POA: Insufficient documentation

## 2014-02-14 MED ORDER — LIDOCAINE-EPINEPHRINE 1 %-1:100000 IJ SOLN
10.0000 mL | Freq: Once | INTRAMUSCULAR | Status: AC
  Administered 2014-02-15: 10 mL
  Filled 2014-02-14: qty 10

## 2014-02-14 NOTE — ED Notes (Signed)
Pt reports being struck in head by some ramps that fell.  Small laceration noted to right side of head.  Bleeding controlled.  Hematoma developing.  Denies any dizziness, nausea or changes in vision.

## 2014-02-15 ENCOUNTER — Ambulatory Visit: Payer: Managed Care, Other (non HMO)

## 2014-02-15 NOTE — ED Notes (Signed)
Pt discharged at [place] on 02/15/14 during system downtime. Pt given discharge instructions and prescription for Norco. Nad noted at that time.

## 2014-02-15 NOTE — Telephone Encounter (Signed)
Patient informed of x-ray results. 

## 2014-02-15 NOTE — ED Provider Notes (Signed)
Medical screening examination/treatment/procedure(s) were performed by non-physician practitioner and as supervising physician I was immediately available for consultation/collaboration.   EKG Interpretation None       Teressa Lower, MD 02/15/14 2347

## 2014-02-15 NOTE — ED Provider Notes (Signed)
CSN: [id number]     Arrival date & time 02/14/14  2155 History   First MD Initiated Contact with Patient 02/14/14 2326     Chief Complaint  Patient presents with  . Head Laceration     (Consider location/radiation/quality/duration/timing/severity/associated sxs/prior Treatment) HPI Comments: 1 patient is a 38 year old female who presents to the emergency room with the complaint of contusion and laceration to the scalp. The patient states that while working outside she had some aluminum ramps against the wall, they fell and hit her on the right side of the head. There was no loss of consciousness, no double vision, no excessive vomiting reported, no change in coordination after the event. The patient noted some blood on the side of her head and cheek, as well as went to take a look and noted that there was a laceration. She attempted to control the bleeding with pressure with some success. She presents now to the emergency department for additional evaluation and management of this particular problem. The patient states that her tetanus status is up-to-date.  Patient is a 38 y.o. female presenting with scalp laceration. The history is provided by the patient and the spouse.  Head Laceration Associated symptoms include headaches. Pertinent negatives include no abdominal pain, arthralgias, chest pain, coughing or neck pain.    Past Medical History  Diagnosis Date  . Chicken pox   . Migraines   . Cancer     melanoma R LE  . Heart murmur   . Varicose veins    Past Surgical History  Procedure Laterality Date  . Wisdom tooth extraction     Family History  Problem Relation Age of Onset  . Hyperlipidemia Mother   . Arthritis Maternal Grandmother   . Diabetes Maternal Grandmother   . Breast cancer      maternal great grandmother   History  Substance Use Topics  . Smoking status: Never Smoker   . Smokeless tobacco: Never Used  . Alcohol Use: Yes     Comment: occ - 1-2 drinks rarely    OB History   Grav Para Term Preterm Abortions TAB SAB Ect Mult Living                 Review of Systems  Constitutional: Negative for activity change.       All ROS Neg except as noted in HPI  HENT: Negative for nosebleeds.   Eyes: Negative for photophobia and discharge.  Respiratory: Negative for cough, shortness of breath and wheezing.   Cardiovascular: Negative for chest pain and palpitations.  Gastrointestinal: Negative for abdominal pain and blood in stool.  Genitourinary: Negative for dysuria, frequency and hematuria.  Musculoskeletal: Negative for arthralgias, back pain and neck pain.  Skin: Negative.   Neurological: Positive for headaches. Negative for dizziness, seizures and speech difficulty.  Psychiatric/Behavioral: Negative for hallucinations and confusion.      Allergies  Review of patient's allergies indicates no known allergies.  Home Medications   Current Outpatient Rx  Name  Route  Sig  Dispense  Refill  . HYDROcodone-acetaminophen (NORCO/VICODIN) 5-325 MG per tablet   Oral   Take 1 tablet by mouth every 6 (six) hours as needed for moderate pain. Monthly for menstrual cycle         . Multiple Vitamins-Minerals (ALIVE WOMENS ENERGY PO)   Oral   Take by mouth daily.         . naproxen sodium (ANAPROX) 220 MG tablet   Oral   Take 220 mg  by mouth as needed.          BP 132/79  Pulse 74  Temp(Src) 98.1 F (36.7 C) (Oral)  Resp 18  Ht 5\' 4"  (1.626 m)  Wt 210 lb (95.255 kg)  BMI 36.03 kg/m2  SpO2 99%  LMP 02/11/2014 Physical Exam  Nursing note and vitals reviewed. Constitutional: She is oriented to person, place, and time. She appears well-developed and well-nourished.  Non-toxic appearance.  HENT:  Head: Normocephalic.    Right Ear: Tympanic membrane and external ear normal.  Left Ear: Tympanic membrane and external ear normal.  Eyes: EOM and lids are normal. Pupils are equal, round, and reactive to light.  Neck: Normal range of  motion. Neck supple. Carotid bruit is not present.  Cardiovascular: Normal rate, regular rhythm, normal heart sounds, intact distal pulses and normal pulses.   Pulmonary/Chest: Breath sounds normal. No respiratory distress.  Abdominal: Soft. Bowel sounds are normal. There is no tenderness. There is no guarding.  Musculoskeletal: Normal range of motion.  Lymphadenopathy:       Head (right side): No submandibular adenopathy present.       Head (left side): No submandibular adenopathy present.    She has no cervical adenopathy.  Neurological: She is alert and oriented to person, place, and time. She has normal strength. No cranial nerve deficit or sensory deficit. Coordination and gait normal. GCS eye subscore is 4. GCS verbal subscore is 5. GCS motor subscore is 6.  Skin: Skin is warm and dry.  Psychiatric: She has a normal mood and affect. Her speech is normal.    ED Course  LACERATION REPAIR Date/Time: 02/14/2014 11:50 PM Performed by: Lenox Ahr Authorized by: Lenox Ahr Consent: Verbal consent obtained. Risks and benefits: risks, benefits and alternatives were discussed Consent given by: patient Patient understanding: patient states understanding of the procedure being performed Patient identity confirmed: arm band Time out: Immediately prior to procedure a "time out" was called to verify the correct patient, procedure, equipment, support staff and site/side marked as required. Body area: head/neck Location details: scalp Laceration length: 2.1 cm Foreign bodies: no foreign bodies Tendon involvement: none Nerve involvement: none Vascular damage: no Anesthesia: local infiltration Local anesthetic: lidocaine 1% with epinephrine Patient sedated: no Preparation: Patient was prepped and draped in the usual sterile fashion. Irrigation solution: saline Amount of cleaning: standard Debridement: none Degree of undermining: none Skin closure: 5-0 nylon Number of sutures:  4 Technique: simple Approximation: close Approximation difficulty: simple Patient tolerance: Patient tolerated the procedure well with no immediate complications.   (including critical care time) Labs Review Labs Reviewed - No data to display Imaging Review No results found.   EKG Interpretation None      MDM   Final diagnoses:  None    **I have reviewed nursing notes, vital signs, and all appropriate lab and imaging results for this patient.*  Patient sustained a laceration to the scalp earlier this evening. There was no loss of consciousness. The wound was repaired with 4 interrupted sutures of 5-0 nylon. The patient is advised to keep the wound clean and dry, she is to have the sutures taken out in 5-7 days. She is to come back sooner if any changes or problems.  Lenox Ahr, PA-C 02/15/14 1733

## 2014-02-20 ENCOUNTER — Encounter (HOSPITAL_COMMUNITY): Payer: Self-pay | Admitting: Emergency Medicine

## 2014-02-20 ENCOUNTER — Emergency Department (HOSPITAL_COMMUNITY)
Admission: EM | Admit: 2014-02-20 | Discharge: 2014-02-20 | Disposition: A | Discharge status: Home / Self Care | Payer: Managed Care, Other (non HMO) | Attending: Emergency Medicine | Admitting: Emergency Medicine

## 2014-02-20 ENCOUNTER — Ambulatory Visit: Payer: Managed Care, Other (non HMO) | Admitting: Physical Therapy

## 2014-02-20 DIAGNOSIS — F0781 Postconcussional syndrome: Secondary | ICD-10-CM | POA: Insufficient documentation

## 2014-02-20 DIAGNOSIS — G44309 Post-traumatic headache, unspecified, not intractable: Secondary | ICD-10-CM

## 2014-02-20 DIAGNOSIS — Z85820 Personal history of malignant melanoma of skin: Secondary | ICD-10-CM | POA: Insufficient documentation

## 2014-02-20 DIAGNOSIS — Z8619 Personal history of other infectious and parasitic diseases: Secondary | ICD-10-CM | POA: Insufficient documentation

## 2014-02-20 DIAGNOSIS — Z4802 Encounter for removal of sutures: Secondary | ICD-10-CM | POA: Insufficient documentation

## 2014-02-20 DIAGNOSIS — R011 Cardiac murmur, unspecified: Secondary | ICD-10-CM | POA: Insufficient documentation

## 2014-02-20 DIAGNOSIS — Z8679 Personal history of other diseases of the circulatory system: Secondary | ICD-10-CM | POA: Insufficient documentation

## 2014-02-20 NOTE — ED Notes (Signed)
4 sutures removed.

## 2014-02-20 NOTE — Discharge Instructions (Signed)
Post-Concussion Syndrome Post-concussion syndrome describes the symptoms that can occur after a head injury. These symptoms can last from weeks to months. CAUSES  It is not clear why some head injuries cause post-concussion syndrome. It can occur whether your head injury was mild or severe and whether you were wearing head protection or not.  SIGNS AND SYMPTOMS  Memory difficulties.  Dizziness.  Headaches.  Double vision or blurry vision.  Sensitivity to light.  Hearing difficulties.  Depression.  Tiredness.  Weakness.  Difficulty with concentration.  Difficulty sleeping or staying asleep.  Vomiting.  Poor balance or instability on your feet.  Slow reaction time.  Difficulty learning and remembering things you have heard. DIAGNOSIS  There is no test to determine whether you have post-concussion syndrome. Your health care provider may order an imaging scan of your brain, such as a CT scan, to check for other problems that may be causing your symptoms (such as severe injury inside your skull). TREATMENT  Usually, these problems disappear over time without medical care. Your health care provider may prescribe medicine to help ease your symptoms. It is important to follow up with a neurologist to evaluate your recovery and address any lingering symptoms or issues. HOME CARE INSTRUCTIONS   Only take over-the-counter or prescription medicines for pain, discomfort, or fever as directed by your health care provider. Do not take aspirin. Aspirin can slow blood clotting.  Sleep with your head slightly elevated to help with headaches.  Avoid any situation where there is potential for another head injury (football, hockey, soccer, basketball, martial arts, downhill snow sports, and horseback riding). Your condition will get worse every time you experience a concussion. You should avoid these activities until you are evaluated by the appropriate follow-up health care  providers.  Keep all follow-up appointments as directed by your health care provider. SEEK IMMEDIATE MEDICAL CARE IF:  You develop confusion or unusual drowsiness.  You cannot wake the injured person.  You develop nausea or persistent, forceful vomiting.  You feel like you are moving when you are not (vertigo).  You notice the injured person's eyes moving rapidly back and forth. This may be a sign of vertigo.  You have convulsions or faint.  You have severe, persistent headaches that are not relieved by medicine.  You cannot use your arms or legs normally.  Your pupils change size.  You have clear or bloody discharge from the nose or ears.  Your problems are getting worse, not better. MAKE SURE YOU:  Understand these instructions.  Will watch your condition.  Will get help right away if you are not doing well or get worse. Document Released: 04/18/2002 Document Revised: 08/17/2013 Document Reviewed: 05/15/2011 [facility] Patient Information 2014 [place].  Suture Removal, Care After Refer to this sheet in the next few weeks. These instructions provide you with information on caring for yourself after your procedure. Your health care provider may also give you more specific instructions. Your treatment has been planned according to current medical practices, but problems sometimes occur. Call your health care provider if you have any problems or questions after your procedure. WHAT TO EXPECT AFTER THE PROCEDURE After your stitches (sutures) are removed, it is typical to have the following:  Some discomfort and swelling in the wound area.  Slight redness in the area. HOME CARE INSTRUCTIONS   If you have skin adhesive strips over the wound area, do not take the strips off. They will fall off on their own in a few  days. If the strips remain in place after 14 days, you may remove them.  Change any bandages (dressings) at least once a day or as directed by your health  care provider. If the bandage sticks, soak it off with warm, soapy water.  Apply cream or ointment only as directed by your health care provider. If using cream or ointment, wash the area with soap and water 2 times a day to remove all the cream or ointment. Rinse off the soap and pat the area dry with a clean towel.  Keep the wound area dry and clean. If the bandage becomes wet or dirty, or if it develops a bad smell, change it as soon as possible.  Continue to protect the wound from injury.  Use sunscreen when out in the sun. New scars become sunburned easily. SEEK MEDICAL CARE IF:  You have increasing redness, swelling, or pain in the wound.  You see pus coming from the wound.  You have a fever.  You notice a bad smell coming from the wound or dressing.  Your wound breaks open (edges not staying together). Document Released: 07/22/2001 Document Revised: 08/17/2013 Document Reviewed: 06/08/2013 [facility] Patient Information 2014 [place].

## 2014-02-20 NOTE — ED Notes (Addendum)
Here for stitch removal forehead.   Also c/o headaches since she got hit in the head.

## 2014-02-20 NOTE — ED Notes (Signed)
Pt no longer in room when I went in .

## 2014-02-20 NOTE — ED Notes (Signed)
Here for suture removal to  Rt forehead, . Appears well healed.

## 2014-02-22 ENCOUNTER — Encounter: Payer: Managed Care, Other (non HMO) | Admitting: Physical Therapy

## 2014-02-22 NOTE — ED Provider Notes (Signed)
CSN: [id number]     Arrival date & time 02/20/14  1803 History   First MD Initiated Contact with Patient 02/20/14 1839     Chief Complaint  Patient presents with  . Suture / Staple Removal     (Consider location/radiation/quality/duration/timing/severity/associated sxs/prior Treatment) HPI Comments: ELFA WOOTON is a 38 y.o. Female presenting for suture removal.  She sustained a laceration 6 days ago when a metal ramp fell, striking her across her scalp.  She has had no problems with the stitches and no pain, swelling, redness or drainage from the wound site.  She does describe having daily mild generalized headaches since her injury which responds to tylenol. She states these headaches seem to be getting better, but are still present.  She denies dizziness, nausea, vomiting, difficulty concentrating (works in an [employer] and has been able to return to work), focal weakness or visual changes.     The history is provided by the patient.    Past Medical History  Diagnosis Date  . Chicken pox   . Migraines   . Cancer     melanoma R LE  . Heart murmur   . Varicose veins    Past Surgical History  Procedure Laterality Date  . Wisdom tooth extraction     Family History  Problem Relation Age of Onset  . Hyperlipidemia Mother   . Arthritis Maternal Grandmother   . Diabetes Maternal Grandmother   . Breast cancer      maternal great grandmother   History  Substance Use Topics  . Smoking status: Never Smoker   . Smokeless tobacco: Never Used  . Alcohol Use: Yes     Comment: occ - 1-2 drinks rarely   OB History   Grav Para Term Preterm Abortions TAB SAB Ect Mult Living                 Review of Systems  Constitutional: Negative for fever.  HENT: Negative for sore throat.   Eyes: Negative.  Negative for photophobia and visual disturbance.  Respiratory: Negative.   Cardiovascular: Negative for chest pain.  Gastrointestinal: Negative for nausea and  vomiting.  Genitourinary: Negative.   Musculoskeletal: Negative for arthralgias, joint swelling and neck pain.  Skin: Positive for wound.  Neurological: Positive for headaches. Negative for dizziness, weakness, light-headedness and numbness.  Psychiatric/Behavioral: Negative.       Allergies  Review of patient's allergies indicates no known allergies.  Home Medications   Prior to Admission medications   Medication Sig Start Date End Date Taking? Authorizing Provider  HYDROcodone-acetaminophen (NORCO/VICODIN) 5-325 MG per tablet Take 1 tablet by mouth every 6 (six) hours as needed for moderate pain. Monthly for menstrual cycle    Historical Provider, MD  Multiple Vitamins-Minerals (ALIVE WOMENS ENERGY PO) Take by mouth daily.    Historical Provider, MD  naproxen sodium (ANAPROX) 220 MG tablet Take 220 mg by mouth as needed.    Historical Provider, MD   BP 144/76  Pulse 68  Temp(Src) 98 F (36.7 C) (Oral)  Ht 5\' 3"  (1.6 m)  Wt 210 lb (95.255 kg)  BMI 37.21 kg/m2  SpO2 100%  LMP 02/11/2014 Physical Exam  Nursing note and vitals reviewed. Constitutional: She is oriented to person, place, and time. She appears well-developed and well-nourished.  HENT:  Head: Normocephalic.  Mouth/Throat: Oropharynx is clear and moist.  Well healed laceration right scalp  Eyes: EOM are normal. Pupils are equal, round, and reactive to light.  Neck: Normal range  of motion. Neck supple.  Cardiovascular: Normal rate.   Pulmonary/Chest: Effort normal.  Musculoskeletal: Normal range of motion.  Lymphadenopathy:    She has no cervical adenopathy.  Neurological: She is alert and oriented to person, place, and time. She has normal strength. No cranial nerve deficit or sensory deficit. Coordination and gait normal. GCS eye subscore is 4. GCS verbal subscore is 5. GCS motor subscore is 6.  Normal heel-shin, normal rapid alternating movements. Cranial nerves III-XII intact.  No pronator drift.  Skin: Skin  is warm and dry. No rash noted.  Psychiatric: She has a normal mood and affect. Her speech is normal and behavior is normal. Thought content normal. Cognition and memory are normal.    ED Course  Procedures (including critical care time) Labs Review Labs Reviewed - No data to display  Imaging Review No results found.   EKG Interpretation None      MDM   Final diagnoses:  Visit for suture removal  Post-concussion headache    Pt here for suture removal with very nicely healed right scalp laceration.  Sutures removed by RN.  Improving headaches with no red flag sx, normal neuro exam.  Suspect mild post concussion syndrome.  Reassurance given.  She was given information about minor head injuries and encouraged f/u with her pcp if sx do not continue to improve or resolve completely over the next week.  The patient appears reasonably screened and/or stabilized for discharge and I doubt any other medical condition or other [facility] requiring further screening, evaluation, or treatment in the ED at this time prior to discharge.     Evalee Jefferson, PA-C 02/22/14 2328

## 2014-02-23 NOTE — ED Provider Notes (Signed)
Medical screening examination/treatment/procedure(s) were conducted as a shared visit with non-physician practitioner(s) and myself.  I personally evaluated the patient during the encounter.   EKG Interpretation None        Tanna Furry, MD 02/23/14 [phone]

## 2014-02-28 ENCOUNTER — Ambulatory Visit: Payer: Managed Care, Other (non HMO) | Admitting: Physical Therapy

## 2014-03-07 ENCOUNTER — Ambulatory Visit: Payer: Managed Care, Other (non HMO)

## 2014-03-13 ENCOUNTER — Ambulatory Visit: Payer: Managed Care, Other (non HMO) | Admitting: Physical Medicine & Rehabilitation

## 2014-03-13 ENCOUNTER — Ambulatory Visit: Payer: Managed Care, Other (non HMO) | Attending: Physical Medicine & Rehabilitation

## 2014-03-13 DIAGNOSIS — M25519 Pain in unspecified shoulder: Secondary | ICD-10-CM | POA: Insufficient documentation

## 2014-03-13 DIAGNOSIS — Z5189 Encounter for other specified aftercare: Secondary | ICD-10-CM | POA: Insufficient documentation

## 2014-03-13 DIAGNOSIS — M542 Cervicalgia: Secondary | ICD-10-CM | POA: Insufficient documentation

## 2014-04-14 ENCOUNTER — Ambulatory Visit: Payer: Managed Care, Other (non HMO) | Admitting: Physical Medicine & Rehabilitation

## 2014-11-12 ENCOUNTER — Encounter (HOSPITAL_COMMUNITY): Payer: Self-pay

## 2014-11-12 ENCOUNTER — Emergency Department (HOSPITAL_COMMUNITY)
Admission: EM | Admit: 2014-11-12 | Discharge: 2014-11-12 | Disposition: A | Discharge status: Home / Self Care | Payer: Managed Care, Other (non HMO) | Source: Home / Self Care | Attending: Family Medicine | Admitting: Family Medicine

## 2014-11-12 DIAGNOSIS — B309 Viral conjunctivitis, unspecified: Secondary | ICD-10-CM

## 2014-11-12 DIAGNOSIS — J029 Acute pharyngitis, unspecified: Secondary | ICD-10-CM

## 2014-11-12 DIAGNOSIS — J069 Acute upper respiratory infection, unspecified: Secondary | ICD-10-CM

## 2014-11-12 DIAGNOSIS — R0982 Postnasal drip: Secondary | ICD-10-CM

## 2014-11-12 NOTE — ED Provider Notes (Signed)
CSN: [id number]     Arrival date & time 11/12/14  7517 History   First MD Initiated Contact with Patient 11/12/14 [phone]     Chief Complaint  Patient presents with  . URI   (Consider location/radiation/quality/duration/timing/severity/associated sxs/prior Treatment) HPI Comments: 39 year old female states that she has had URI symptoms all week. Primarily sore throat that began approximately 8 days ago. It tends to come and go. Positive for PND. Her irritable often get stopped up. She had some red eyes and drainage this morning. At time she felt hot suggesting a subjective fever but never measured or documented. She is afebrile here Denies GI symptoms.   Past Medical History  Diagnosis Date  . Chicken pox   . Migraines   . Cancer     melanoma R LE  . Heart murmur   . Varicose veins    Past Surgical History  Procedure Laterality Date  . Wisdom tooth extraction     Family History  Problem Relation Age of Onset  . Hyperlipidemia Mother   . Arthritis Maternal Grandmother   . Diabetes Maternal Grandmother   . Breast cancer      maternal great grandmother   History  Substance Use Topics  . Smoking status: Never Smoker   . Smokeless tobacco: Never Used  . Alcohol Use: Yes     Comment: occ - 1-2 drinks rarely   OB History    No data available     Review of Systems  Constitutional: Positive for activity change and appetite change.  HENT: Positive for congestion, postnasal drip and sore throat. Negative for ear pain.   Eyes: Positive for discharge and redness.  Respiratory: Positive for cough. Negative for shortness of breath and wheezing.   Gastrointestinal: Negative.   Musculoskeletal: Negative.   Skin: Negative for rash.    Allergies  Review of patient's allergies indicates no known allergies.  Home Medications   Prior to Admission medications   Medication Sig Start Date End Date Taking? Authorizing Provider  HYDROcodone-acetaminophen (NORCO/VICODIN) 5-325 MG per  tablet Take 1 tablet by mouth every 6 (six) hours as needed for moderate pain. Monthly for menstrual cycle    Historical Provider, MD  Multiple Vitamins-Minerals (ALIVE WOMENS ENERGY PO) Take by mouth daily.    Historical Provider, MD  naproxen sodium (ANAPROX) 220 MG tablet Take 220 mg by mouth as needed.    Historical Provider, MD   BP 126/83 mmHg  Pulse 71  Temp(Src) 98 F (36.7 C) (Oral)  SpO2 98%  LMP 11/03/2014 Physical Exam  Constitutional: She is oriented to person, place, and time. She appears well-developed. No distress.  HENT:  Bilateral TMs are normal, no retraction, erythema, effusion or other abnormalities. Oropharynx with minor erythema, cobblestoning and clear PND. No exudates.  Eyes: Conjunctivae and EOM are normal.  Neck: Normal range of motion. Neck supple.  Cardiovascular: Normal rate, regular rhythm, normal heart sounds and intact distal pulses.   Pulmonary/Chest: Effort normal and breath sounds normal. No respiratory distress. She has no wheezes. She has no rales.  Musculoskeletal: She exhibits no edema or tenderness.  Lymphadenopathy:    She has no cervical adenopathy.  Neurological: She is alert and oriented to person, place, and time. No cranial nerve deficit. She exhibits normal muscle tone. Coordination normal.  Skin: Skin is warm and dry.  Psychiatric: She has a normal mood and affect.  Nursing note and vitals reviewed.   ED Course  Procedures (including critical care time) Labs Review Labs Reviewed -  No data to display  Imaging Review No results found.   MDM   1. URI (upper respiratory infection)   2. PND (post-nasal drip)   3. Sore throat   4. Viral conjunctivitis    Lots of fluids' Cepacol lozenges Ibuprofen 600 mg every 6 hours as needed Antihistamine for drainage URI instructions     Janne Napoleon, NP 11/12/14 1002

## 2014-11-12 NOTE — ED Notes (Signed)
C/o URI type symptoms since 12-26. Minimal relief w OTC medications

## 2014-11-12 NOTE — Discharge Instructions (Signed)
Sore Throat A sore throat is pain, burning, irritation, or scratchiness of the throat. There is often pain or tenderness when swallowing or talking. A sore throat may be accompanied by other symptoms, such as coughing, sneezing, fever, and swollen neck glands. A sore throat is often the first sign of another sickness, such as a cold, flu, strep throat, or mononucleosis (commonly known as mono). Most sore throats go away without medical treatment. CAUSES  The most common causes of a sore throat include:  A viral infection, such as a cold, flu, or mono.  A bacterial infection, such as strep throat, tonsillitis, or whooping cough.  Seasonal allergies.  Dryness in the air.  Irritants, such as smoke or pollution.  Gastroesophageal reflux disease (GERD). HOME CARE INSTRUCTIONS   Only take over-the-counter medicines as directed by your caregiver.  Drink enough fluids to keep your urine clear or pale yellow.  Rest as needed.  Try using throat sprays, lozenges, or sucking on hard candy to ease any pain (if older than 4 years or as directed).  Sip warm liquids, such as broth, herbal tea, or warm water with honey to relieve pain temporarily. You may also eat or drink cold or frozen liquids such as frozen ice pops.  Gargle with salt water (mix 1 tsp salt with 8 oz of water).  Do not smoke and avoid secondhand smoke.  Put a cool-mist humidifier in your bedroom at night to moisten the air. You can also turn on a hot shower and sit in the bathroom with the door closed for 5-10 minutes. SEEK IMMEDIATE MEDICAL CARE IF:  You have difficulty breathing.  You are unable to swallow fluids, soft foods, or your saliva.  You have increased swelling in the throat.  Your sore throat does not get better in 7 days.  You have nausea and vomiting.  You have a fever or persistent symptoms for more than 2-3 days.  You have a fever and your symptoms suddenly get worse. MAKE SURE YOU:   Understand  these instructions.  Will watch your condition.  Will get help right away if you are not doing well or get worse. Document Released: 12/04/2004 Document Revised: 10/13/2012 Document Reviewed: 07/04/2012 [facility] Patient Information 2015 [place], [place]. This information is not intended to replace advice given to you by your health care provider. Make sure you discuss any questions you have with your health care provider.  Upper Respiratory Infection, Adult Lots of fluids' Cepacol lozenges Ibuprofen 600 mg every 6 hours as needed Antihistamine for drainage An upper respiratory infection (URI) is also sometimes known as the common cold. The upper respiratory tract includes the nose, sinuses, throat, trachea, and bronchi. Bronchi are the airways leading to the lungs. Most people improve within 1 week, but symptoms can last up to 2 weeks. A residual cough may last even longer.  CAUSES Many different viruses can infect the tissues lining the upper respiratory tract. The tissues become irritated and inflamed and often become very moist. Mucus production is also common. A cold is contagious. You can easily spread the virus to others by oral contact. This includes kissing, sharing a glass, coughing, or sneezing. Touching your mouth or nose and then touching a surface, which is then touched by another person, can also spread the virus. SYMPTOMS  Symptoms typically develop 1 to 3 days after you come in contact with a cold virus. Symptoms vary from person to person. They may include:  Runny nose.  Sneezing.  Nasal congestion.  Sinus irritation.  Sore throat.  Loss of voice (laryngitis).  Cough.  Fatigue.  Muscle aches.  Loss of appetite.  Headache.  Low-grade fever. DIAGNOSIS  You might diagnose your own cold based on familiar symptoms, since most people get a cold 2 to 3 times a year. Your caregiver can confirm this based on your exam. Most importantly, your caregiver can check that  your symptoms are not due to another disease such as strep throat, sinusitis, pneumonia, asthma, or epiglottitis. Blood tests, throat tests, and X-rays are not necessary to diagnose a common cold, but they may sometimes be helpful in excluding other more serious diseases. Your caregiver will decide if any further tests are required. RISKS AND COMPLICATIONS  You may be at risk for a more severe case of the common cold if you smoke cigarettes, have chronic heart disease (such as heart failure) or lung disease (such as asthma), or if you have a weakened immune system. The very young and very old are also at risk for more serious infections. Bacterial sinusitis, middle ear infections, and bacterial pneumonia can complicate the common cold. The common cold can worsen asthma and chronic obstructive pulmonary disease (COPD). Sometimes, these complications can require emergency medical care and may be life-threatening. PREVENTION  The best way to protect against getting a cold is to practice good hygiene. Avoid oral or hand contact with people with cold symptoms. Wash your hands often if contact occurs. There is no clear evidence that vitamin C, vitamin E, echinacea, or exercise reduces the chance of developing a cold. However, it is always recommended to get plenty of rest and practice good nutrition. TREATMENT  Treatment is directed at relieving symptoms. There is no cure. Antibiotics are not effective, because the infection is caused by a virus, not by bacteria. Treatment may include:  Increased fluid intake. Sports drinks offer valuable electrolytes, sugars, and fluids.  Breathing heated mist or steam (vaporizer or shower).  Eating chicken soup or other clear broths, and maintaining good nutrition.  Getting plenty of rest.  Using gargles or lozenges for comfort.  Controlling fevers with ibuprofen or acetaminophen as directed by your caregiver.  Increasing usage of your inhaler if you have  asthma. Zinc gel and zinc lozenges, taken in the first 24 hours of the common cold, can shorten the duration and lessen the severity of symptoms. Pain medicines may help with fever, muscle aches, and throat pain. A variety of non-prescription medicines are available to treat congestion and runny nose. Your caregiver can make recommendations and may suggest nasal or lung inhalers for other symptoms.  HOME CARE INSTRUCTIONS   Only take over-the-counter or prescription medicines for pain, discomfort, or fever as directed by your caregiver.  Use a warm mist humidifier or inhale steam from a shower to increase air moisture. This may keep secretions moist and make it easier to breathe.  Drink enough water and fluids to keep your urine clear or pale yellow.  Rest as needed.  Return to work when your temperature has returned to normal or as your caregiver advises. You may need to stay home longer to avoid infecting others. You can also use a face mask and careful hand washing to prevent spread of the virus. SEEK MEDICAL CARE IF:   After the first few days, you feel you are getting worse rather than better.  You need your caregiver's advice about medicines to control symptoms.  You develop chills, worsening shortness of breath, or brown or red sputum. These  may be signs of pneumonia.  You develop yellow or brown nasal discharge or pain in the face, especially when you bend forward. These may be signs of sinusitis.  You develop a fever, swollen neck glands, pain with swallowing, or white areas in the back of your throat. These may be signs of strep throat. SEEK IMMEDIATE MEDICAL CARE IF:   You have a fever.  You develop severe or persistent headache, ear pain, sinus pain, or chest pain.  You develop wheezing, a prolonged cough, cough up blood, or have a change in your usual mucus (if you have chronic lung disease).  You develop sore muscles or a stiff neck. Document Released: 04/22/2001  Document Revised: 01/19/2012 Document Reviewed: 02/01/2014 [facility] Patient Information 2015 [place], [place]. This information is not intended to replace advice given to you by your health care provider. Make sure you discuss any questions you have with your health care provider.  Conjunctivitis Zaditor eye drops for inflammation and redness Conjunctivitis is commonly called "pink eye." Conjunctivitis can be caused by bacterial or viral infection, allergies, or injuries. There is usually redness of the lining of the eye, itching, discomfort, and sometimes discharge. There may be deposits of matter along the eyelids. A viral infection usually causes a watery discharge, while a bacterial infection causes a yellowish, thick discharge. Pink eye is very contagious and spreads by direct contact. You may be given antibiotic eyedrops as part of your treatment. Before using your eye medicine, remove all drainage from the eye by washing gently with warm water and cotton balls. Continue to use the medication until you have awakened 2 mornings in a row without discharge from the eye. Do not rub your eye. This increases the irritation and helps spread infection. Use separate towels from other household members. Wash your hands with soap and water before and after touching your eyes. Use cold compresses to reduce pain and sunglasses to relieve irritation from light. Do not wear contact lenses or wear eye makeup until the infection is gone. SEEK MEDICAL CARE IF:   Your symptoms are not better after 3 days of treatment.  You have increased pain or trouble seeing.  The outer eyelids become very red or swollen. Document Released: 12/04/2004 Document Revised: 01/19/2012 Document Reviewed: 10/27/2005 [facility] Patient Information 2015 [place], [place]. This information is not intended to replace advice given to you by your health care provider. Make sure you discuss any questions you have with your health care  provider.

## 2014-11-21 ENCOUNTER — Ambulatory Visit (INDEPENDENT_AMBULATORY_CARE_PROVIDER_SITE_OTHER): Payer: Managed Care, Other (non HMO) | Admitting: Family Medicine

## 2014-11-21 ENCOUNTER — Encounter: Payer: Self-pay | Admitting: Family Medicine

## 2014-11-21 VITALS — BP 130/90 | HR 70 | Temp 98.5°F | Ht 63.0 in | Wt 210.2 lb

## 2014-11-21 DIAGNOSIS — J329 Chronic sinusitis, unspecified: Secondary | ICD-10-CM

## 2014-11-21 DIAGNOSIS — J029 Acute pharyngitis, unspecified: Secondary | ICD-10-CM

## 2014-11-21 MED ORDER — AMOXICILLIN 500 MG PO CAPS: 500.0000 mg | ORAL_CAPSULE | Freq: Two times a day (BID) | ORAL | Status: DC

## 2014-11-21 NOTE — Patient Instructions (Signed)
Take the antibiotic as instructed  Flonase 1-2 sprays each nostril daily for 21 days  Claritin or allegra once daily for 21 days

## 2014-11-21 NOTE — Progress Notes (Signed)
HPI:  -started: about 2 weeks ago -symptoms:nasal congestion, sore throat, cough, conjunctivitis, L ear pressure - has not improved -denies:fever, SOB, NVD, tooth pain -has tried: [facility]CC 1/3 and told URI - OTC; she was worried for strep -sick contacts/travel/risks: denies flu exposure, tick exposure or or Ebola risks - husband had the same symptoms, but better -Hx of: allergies - no medication for this  ROS: See pertinent positives and negatives per HPI.  Past Medical History  Diagnosis Date  . Chicken pox   . Migraines   . Cancer     melanoma R LE  . Heart murmur   . Varicose veins     Past Surgical History  Procedure Laterality Date  . Wisdom tooth extraction      Family History  Problem Relation Age of Onset  . Hyperlipidemia Mother   . Arthritis Maternal Grandmother   . Diabetes Maternal Grandmother   . Breast cancer      maternal great grandmother    History   Social History  . Marital Status: Married    Spouse Name: N/A    Number of Children: N/A  . Years of Education: N/A   Social History Main Topics  . Smoking status: Never Smoker   . Smokeless tobacco: Never Used  . Alcohol Use: Yes     Comment: occ - 1-2 drinks rarely  . Drug Use: No  . Sexual Activity: Yes     Comment: with spouse   Other Topics Concern  . 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 Monthly for menstrual cycle, Disp: , Rfl: ;  ibuprofen (ADVIL,MOTRIN) 200 MG tablet, Take 200 mg by mouth as needed (sore throat)., Disp:  , Rfl: ;  Multiple Vitamins-Minerals (ALIVE WOMENS ENERGY PO), Take by mouth daily., Disp: , Rfl:  naproxen sodium (ANAPROX) 220 MG tablet, Take 220 mg by mouth as needed., Disp: , Rfl: ;  amoxicillin (AMOXIL) 500 MG capsule, Take 1 capsule (500 mg total) by mouth 2 (two) times daily., Disp: 30 capsule, Rfl: 0  EXAM:  Filed Vitals:   11/21/14 1024  BP: 130/90  Pulse: 70  Temp: 98.5 F (36.9 C)    Body mass index is 37.24 kg/(m^2).  GENERAL: vitals reviewed and listed above, alert, oriented, appears well hydrated and in no acute distress  HEENT: atraumatic, conjunttiva clear, no obvious abnormalities on inspection of external nose and ears, normal appearance of ear canals and TMs except for clear effusion R, clear nasal congestion, mild post oropharyngeal erythema with PND, 2 + tonsillar edema or exudate, no sinus TTP  NECK: no obvious masses on inspection  LUNGS: clear to auscultation bilaterally, no wheezes, rales or rhonchi, good air movement  CV: HRRR, no peripheral edema  MS: moves all extremities without noticeable abnormality  PSYCH: pleasant and cooperative, no obvious depression or anxiety  ASSESSMENT AND PLAN:  Discussed the following assessment and plan:  Acute pharyngitis, unspecified pharyngitis type -  Plan: amoxicillin (AMOXIL) 500 MG capsule  Rhinosinusitis  -We discussed potential etiologies, with VURI being most likely vs allergies or pharyngitis viral or bacterial vs other. We discussed treatment side effects, likely course, antibiotic misuse, transmission, and signs of developing a serious illness. -She opted for amoxicillin in case missed strep or bacterial pharyngitis or sinusitis and increasing allergy regimen. -of course, we advised to return or notify a doctor immediately if symptoms worsen or persist or new concerns arise.    Patient Instructions  Take the antibiotic as instructed  Flonase 1-2 sprays each nostril daily for 21 days  Claritin or  allegra once daily for 21 days     Supreme Rybarczyk R.

## 2014-11-21 NOTE — Progress Notes (Signed)
Pre visit review using our clinic review tool, if applicable. No additional management support is needed unless otherwise documented below in the visit note. 

## 2015-07-06 ENCOUNTER — Encounter: Payer: Self-pay | Admitting: Family Medicine

## 2015-07-06 ENCOUNTER — Ambulatory Visit (INDEPENDENT_AMBULATORY_CARE_PROVIDER_SITE_OTHER): Payer: Managed Care, Other (non HMO) | Admitting: Family Medicine

## 2015-07-06 ENCOUNTER — Other Ambulatory Visit: Payer: Self-pay | Admitting: Family Medicine

## 2015-07-06 VITALS — BP 120/82 | HR 84 | Temp 98.6°F | Ht 63.0 in | Wt 211.9 lb

## 2015-07-06 DIAGNOSIS — N63 Unspecified lump in breast: Secondary | ICD-10-CM | POA: Diagnosis not present

## 2015-07-06 DIAGNOSIS — J309 Allergic rhinitis, unspecified: Secondary | ICD-10-CM

## 2015-07-06 DIAGNOSIS — J358 Other chronic diseases of tonsils and adenoids: Secondary | ICD-10-CM

## 2015-07-06 DIAGNOSIS — N6310 Unspecified lump in the right breast, unspecified quadrant: Secondary | ICD-10-CM

## 2015-07-06 NOTE — Patient Instructions (Signed)
-  We placed a referral for you as discussed to the ear, nose and throat specialist to evaluate the swelling in your throat and to the breast center to evaluate your breast concern.  If you have not heard from [place] regarding these appointments in 1 week please contact our office.  -for the eat, flonase 2 sprays each nostril daily

## 2015-07-06 NOTE — Progress Notes (Signed)
HPI:  Acute visit for:  Breast pain: -started 2 days ago -has been mashing on breast and thinks has a small knot here -pain is now better -denies: nipple drainage, rash - great GM had breast ca, but no other family members with breast o ovarian ca -FDLMP: August 17th  Allergic rhinitis: -PND chronically, usually clear but with some R ear pain and thicker drainage the last week, cough -denies: DOE, SOB, fevers, sinus pain -hx of allergy issues   ROS: See pertinent positives and negatives per HPI.  Past Medical History  Diagnosis Date  . Chicken pox   . Migraines   . Cancer     melanoma R LE  . Heart murmur   . Varicose veins     Past Surgical History  Procedure Laterality Date  . Wisdom tooth extraction      Family History  Problem Relation Age of Onset  . Hyperlipidemia Mother   . Arthritis Maternal Grandmother   . Diabetes Maternal Grandmother   . Breast cancer      maternal great grandmother    Social History   Social History  . Marital Status: Married    Spouse Name: N/A  . Number of Children: N/A  . Years of Education: N/A   Social History Main Topics  . Smoking status: Never Smoker   . Smokeless tobacco: Never Used  . Alcohol Use: Yes     Comment: occ - 1-2 drinks rarely  . Drug Use: No  . Sexual Activity: Yes     Comment: with spouse   Other Topics Concern  . 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  Ascorbic Acid (VITAMIN C PO), Take by mouth., Disp: , Rfl:  .  HYDROcodone-acetaminophen (NORCO/VICODIN) 5-325 MG per tablet, Take 1 tablet by mouth every 6 (six) hours as needed for moderate pain. Monthly  for menstrual cycle, Disp: , Rfl:  .  Multiple Vitamins-Minerals (ALIVE WOMENS ENERGY PO), Take by mouth daily., Disp: , Rfl:  .  naproxen sodium (ANAPROX) 220 MG tablet, Take 220 mg by mouth as needed., Disp: , Rfl:   EXAM:  Filed Vitals:   07/06/15 1027  BP: 120/82  Pulse: 84  Temp: 98.6 F (37 C)    Body mass index is 37.55 kg/(m^2).  GENERAL: vitals reviewed and listed above, alert, oriented, appears well hydrated and in no acute distress  HEENT: atraumatic, conjunttiva clear, no obvious abnormalities on inspection of external nose and ears, normal appearance of ear canals and TMs, clear nasal congestion, mild post oropharyngeal erythema with PND, R tonsil with 2+ smooth enlargement, no sinus TTP   NECK: no obvious masses on inspection  LUNGS: clear to auscultation bilaterally, no wheezes, rales or rhonchi, good air movement  BREAST: normal appearance of both breasts, TTP and 2 cm area of rubbery mobile density in R breast at 10 O'clock, TTP  CV: HRRR, no peripheral edema  MS: moves all extremities without noticeable abnormality  PSYCH: pleasant and cooperative, no obvious depression or anxiety  ASSESSMENT AND PLAN:  Discussed the following assessment and plan:  Tonsil asymmetry - Plan: Ambulatory referral to ENT -denies pain in throat, but chronic globus sensation on further questioning for the last few months -refer ENT for evaluation  Breast mass, right - Plan: [facility] R -refer breast center  Allergic rhinitis, unspecified allergic rhinitis type -flonase -Patient advised to return or notify a doctor immediately if symptoms worsen or persist or new concerns arise.  There are no Patient Instructions on file for this visit.   Colin Benton R.

## 2015-07-06 NOTE — Progress Notes (Signed)
Pre visit review using our clinic review tool, if applicable. No additional management support is needed unless otherwise documented below in the visit note. 

## 2015-07-11 ENCOUNTER — Ambulatory Visit
Admission: RE | Admit: 2015-07-11 | Discharge: 2015-07-11 | Disposition: A | Discharge status: Home / Self Care | Payer: Managed Care, Other (non HMO) | Source: Ambulatory Visit | Attending: Family Medicine | Admitting: Family Medicine

## 2015-07-11 ENCOUNTER — Other Ambulatory Visit: Payer: Self-pay | Admitting: Family Medicine

## 2015-07-11 DIAGNOSIS — N6310 Unspecified lump in the right breast, unspecified quadrant: Secondary | ICD-10-CM

## 2015-08-06 ENCOUNTER — Other Ambulatory Visit: Payer: Self-pay | Admitting: Family Medicine

## 2015-08-06 DIAGNOSIS — N6310 Unspecified lump in the right breast, unspecified quadrant: Secondary | ICD-10-CM

## 2015-09-10 ENCOUNTER — Ambulatory Visit
Admission: RE | Admit: 2015-09-10 | Discharge: 2015-09-10 | Disposition: A | Discharge status: Home / Self Care | Payer: Managed Care, Other (non HMO) | Source: Ambulatory Visit | Attending: Family Medicine | Admitting: Family Medicine

## 2015-09-10 DIAGNOSIS — N6310 Unspecified lump in the right breast, unspecified quadrant: Secondary | ICD-10-CM

## 2016-04-08 ENCOUNTER — Ambulatory Visit (INDEPENDENT_AMBULATORY_CARE_PROVIDER_SITE_OTHER): Payer: Managed Care, Other (non HMO) | Admitting: Family Medicine

## 2016-04-08 ENCOUNTER — Encounter: Payer: Self-pay | Admitting: Family Medicine

## 2016-04-08 VITALS — BP 122/84 | HR 81 | Temp 99.2°F | Resp 12 | Ht 64.0 in | Wt 215.1 lb

## 2016-04-08 DIAGNOSIS — J069 Acute upper respiratory infection, unspecified: Secondary | ICD-10-CM

## 2016-04-08 DIAGNOSIS — R062 Wheezing: Secondary | ICD-10-CM

## 2016-04-08 MED ORDER — IPRATROPIUM-ALBUTEROL 0.5-2.5 (3) MG/3ML IN SOLN: 3.0000 mL | Freq: Once | RESPIRATORY_TRACT | Status: DC

## 2016-04-08 MED ORDER — PREDNISONE 20 MG PO TABS: 40.0000 mg | ORAL_TABLET | Freq: Every day | ORAL | Status: AC

## 2016-04-08 MED ORDER — FLUTICASONE PROPIONATE 50 MCG/ACT NA SUSP: 1.0000 | Freq: Two times a day (BID) | NASAL | Status: DC

## 2016-04-08 MED ORDER — ALBUTEROL SULFATE HFA 108 (90 BASE) MCG/ACT IN AERS: 2.0000 | INHALATION_SPRAY | Freq: Four times a day (QID) | RESPIRATORY_TRACT | Status: DC | PRN

## 2016-04-08 MED ORDER — BENZONATATE 100 MG PO CAPS: 200.0000 mg | ORAL_CAPSULE | Freq: Two times a day (BID) | ORAL | Status: DC | PRN

## 2016-04-08 NOTE — Addendum Note (Signed)
Addended by: Milta Deiters on: 04/08/2016 11:48 AM   Modules accepted: Miquel Dunn

## 2016-04-08 NOTE — Patient Instructions (Addendum)
A few things to remember from today's visit:   1. Wheezing  - ipratropium-albuterol (DUONEB) 0.5-2.5 (3) MG/3ML nebulizer solution 3 mL; Take 3 mLs by nebulization once. - predniSONE (DELTASONE) 20 MG tablet; Take 2 tablets (40 mg total) by mouth daily with breakfast.  Dispense: 10 tablet; Refill: 0 - albuterol (PROVENTIL HFA;VENTOLIN HFA) 108 (90 Base) MCG/ACT inhaler; Inhale 2 puffs into the lungs every 6 (six) hours for a week then as needed for wheezing or shortness of breath.  Dispense: 1 Inhaler; Refill: 0  2. URI, acute  - benzonatate (TESSALON) 100 MG capsule; Take 2 capsules (200 mg total) by mouth 2 (two) times daily as needed for cough.  Dispense: 45 capsule; Refill: 0 - fluticasone (FLONASE) 50 MCG/ACT nasal spray; Place 1 spray into both nostrils 2 (two) times daily.  Dispense: 16 g; Refill: 3      If you sign-up for My chart, you can communicate easier with [place] in case you have any question or concern.    viral infections are self-limited and we treat each symptom depending of severity.  Over the counter medications as decongestants and cold medications usually help, they need to be taken with caution if there is a history of high blood pressure or palpitations. Tylenol and/or Ibuprofen also helps with most symptoms (headache, muscle aching, fever,etc) Plenty of fluids. Honey helps with cough.  Steam inhalations helps with runny nose, nasal congestion, and may prevent sinus infections. Cough and nasal congestion could last a few days and sometimes weeks. Please follow in not any better in 1-2 weeks, before if symptoms get worse.

## 2016-04-08 NOTE — Progress Notes (Signed)
Pre visit review using our clinic review tool, if applicable. No additional management support is needed unless otherwise documented below in the visit note. 

## 2016-04-08 NOTE — Progress Notes (Signed)
Subjective:    Patient ID: Kayla Mccall, female    DOB: Sep 14, 1976, 40 y.o.   MRN: [id number]  HPI   Kayla Mccall is a 40 y.o.female here today complaining of 3-4 days of respiratory symptoms.  Started on 04/04/16 with "talking funny", dysphonia, next day mild odynophagia, ears "stopped up", sneezing, rhinorrhea, and nasal congestion. + Productive cough with greenish sputum. + Wheezing, no Hx of asthma or tobacco use. Last night while in bed she felt like she could not breath through mouth, better when she did so thorough nose; not able to bring sputum up. + Chest wall pain with cough. Not sure about fever but sweating last night. No rash or arthralgias.  No Hx of recent travel. No sick contact. No known insect bite. No Hx of allergies.  She has tried OTC Claritin, Mucinex, and Dayquil.  Symptoms otherwise stable.   Review of Systems  Constitutional: Positive for chills and fatigue. Negative for fever and appetite change.  HENT: Positive for congestion, postnasal drip, rhinorrhea, sneezing, sore throat and voice change. Negative for ear pain, facial swelling, hearing loss, mouth sores, nosebleeds, sinus pressure and trouble swallowing.   Eyes: Negative for discharge, redness and itching.  Respiratory: Positive for cough and wheezing. Negative for shortness of breath and stridor.   Cardiovascular: Negative for palpitations and leg swelling.  Gastrointestinal: Negative for nausea, vomiting, abdominal pain and diarrhea.  Musculoskeletal: Negative for myalgias, back pain and neck pain.  Skin: Negative for color change and rash.  Allergic/Immunologic: Negative for environmental allergies.  Neurological: Negative for weakness, numbness and headaches.  Hematological: Negative for adenopathy. Does not bruise/bleed easily.     Current Outpatient Prescriptions on File Prior to Visit  Medication Sig Dispense Refill  . Ascorbic Acid (VITAMIN C PO) Take by mouth.    Marland Kitchen  HYDROcodone-acetaminophen (NORCO/VICODIN) 5-325 MG per tablet Take 1 tablet by mouth every 6 (six) hours as needed for moderate pain. Monthly for menstrual cycle    . Multiple Vitamins-Minerals (ALIVE WOMENS ENERGY PO) Take by mouth daily.    . naproxen sodium (ANAPROX) 220 MG tablet Take 220 mg by mouth as needed.     No current facility-administered medications on file prior to visit.     Past Medical History  Diagnosis Date  . Chicken pox   . Migraines   . Cancer ([place])     melanoma R LE  . Heart murmur   . Varicose veins     Social History   Social History  . Marital Status: Married    Spouse Name: N/A  . Number of Children: N/A  . Years of Education: N/A   Social History Main Topics  . Smoking status: Never Smoker   . Smokeless tobacco: Never Used  . Alcohol Use: Yes     Comment: occ - 1-2 drinks rarely  . Drug Use: No  . Sexual Activity: Yes     Comment: with spouse   Other Topics Concern  . 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  SpO2 Readings from Last 3 Encounters:  04/08/16 97%  07/06/15 99%  11/12/14 98%       Objective:   Physical Exam  Constitutional: She is oriented to person, place, and time. She appears well-developed. She does not appear ill. No distress.  HENT:  Head: Normocephalic and atraumatic.  Right Ear: Tympanic membrane, external ear and ear canal normal.  Left Ear: Tympanic membrane, external ear and ear canal normal.  Nose: Rhinorrhea present. No mucosal edema. Right sinus exhibits no maxillary sinus  tenderness and no frontal sinus tenderness. Left sinus exhibits no maxillary sinus tenderness and no frontal sinus tenderness.  Mouth/Throat: Uvula is midline, oropharynx is clear and moist and mucous membranes are normal. No oral lesions. No oropharyngeal exudate, posterior oropharyngeal edema or posterior oropharyngeal erythema.  + Post nasal drainage.  Eyes: Conjunctivae are normal.  Cardiovascular: Normal rate and regular rhythm.   No murmur heard. Pulmonary/Chest: Effort normal and breath sounds normal. No respiratory distress. She has no wheezes. She has no rhonchi. She has no rales. She exhibits no tenderness.  Prolonged expiration. Barking cough during examination.  Lymphadenopathy:    She has no cervical adenopathy.  Neurological: She is alert and oriented to person, place, and time. Coordination and gait normal.  Skin: Skin is warm. No rash noted.  Psychiatric: She has a normal mood and affect. Her speech is normal.       Assessment & Plan:    Wanona was seen today for uri.  Diagnoses and all orders for this visit:  URI, acute -     benzonatate (TESSALON) 100 MG capsule; Take 2 capsules (200 mg total) by mouth 2 (two) times daily as needed for cough. -     fluticasone (FLONASE) 50 MCG/ACT nasal spray; Place 1 spray into both nostrils 2 (two) times daily.  Wheezing -     ipratropium-albuterol (DUONEB) 0.5-2.5 (3) MG/3ML nebulizer solution 3 mL; Take 3 mLs by nebulization once. -     predniSONE (DELTASONE) 20 MG tablet; Take 2 tablets (40 mg total) by mouth daily with breakfast. -     albuterol (PROVENTIL HFA;VENTOLIN HFA) 108 (90 Base) MCG/ACT inhaler; Inhale 2 puffs into the lungs every 6 (six) hours as needed for wheezing or shortness of breath.   Symptoms suggests a viral etiology, I explained patient that symptomatic treatment is usually recommended in this case, so I do not think abx is needed at this time. Reporting wheezing, not heard on examination today. After  Duoneb neb no rales or rhonchi, prolonged expiration;tolerated treatment well. I do not think imaging is needed today. Some side effects of Prednisone and other medications prescribed today discussed.  Instructed to monitor for signs of complications, including new onset of fever among some, clearly instructed about warning signs. I also explained that cough and nasal congestion can last a few days and sometimes weeks. F/U as needed.     -Patient advised to return or notify a doctor immediately if symptoms worsen or persist or new concerns arise, she voices understanding.    Davonte Siebenaler G. Martinique, MD  [facility]. [place] office.

## 2016-04-14 ENCOUNTER — Telehealth: Payer: Self-pay | Admitting: Family Medicine

## 2016-04-14 ENCOUNTER — Other Ambulatory Visit: Payer: Self-pay | Admitting: Family Medicine

## 2016-04-14 DIAGNOSIS — J0100 Acute maxillary sinusitis, unspecified: Secondary | ICD-10-CM

## 2016-04-14 MED ORDER — AMOXICILLIN-POT CLAVULANATE 875-125 MG PO TABS: 1.0000 | ORAL_TABLET | Freq: Two times a day (BID) | ORAL | Status: AC

## 2016-04-14 NOTE — Telephone Encounter (Signed)
Rx for Augmentin sent to pharmacy. Take also a daily OTC Probiotic. If it is viral or residual symptoms from recent URI, abx will not help.  As I explained cough and sinus congestion could take a few days and even weeks to resolved. F/U with PCP in a week if not better, before if getting worse. Thanks, BJ

## 2016-04-14 NOTE — Progress Notes (Signed)
Because Kayla Mccall is reporting facial pain (teeth) + recently seen for URI, I sent abx to pharmacy to treat as sinusitis.  She needs to follow with PCP in a week if not better.

## 2016-04-14 NOTE — Telephone Encounter (Signed)
Pt saw Dr Martinique 5/30 and was to let her know if she was not better. Pt states she is not better. Pt still having congestion, productive cough with green mucus, green mucus out of nose. Teeth and eyes are hurting.   [facility]/ [address]

## 2016-04-14 NOTE — Telephone Encounter (Signed)
Called patient. No answer. Will try later.  

## 2016-04-15 NOTE — Telephone Encounter (Signed)
Spoke to patient. Gave instructions. Patient verbalized understanding. Patient has already picked up Amoxicillin, will go today to get Probiotic. Report had a constant HA this weekend.

## 2016-10-16 ENCOUNTER — Ambulatory Visit: Payer: Managed Care, Other (non HMO) | Admitting: Family Medicine

## 2016-12-10 ENCOUNTER — Telehealth: Payer: Self-pay | Admitting: Family Medicine

## 2016-12-10 NOTE — Telephone Encounter (Signed)
Error/ltd ° °

## 2016-12-18 NOTE — Progress Notes (Signed)
HPI:  Here for CPE:  -Concerns and/or follow up today: none Missed period. Her gynecologist is aware and advised to call if persists. No breast tenderness or nausea. Husband has reversal remotely. No birth control  -Diet: poor  -Exercise: no regular exercise  -Taking folic acid, vitamin D or calcium: no  -Diabetes and Dyslipidemia Screening: FASTING for labs  -Hx of HTN: no  -Vaccines: flu shot today  -pap history: reports done in 09/2016 with gyn  -FDLMP: 11/03/16  -sexual activity: yes, female partner, no new partners  -wants STI testing (Hep C if born [birth year]): no except HIV with labs  -FH breast, colon or ovarian ca: see FH Last mammogram: due at 63 Last colon cancer screening: n/a  -Alcohol, Tobacco, drug use: see social history  Review of Systems - no fevers, unintentional weight loss, vision loss, hearing loss, chest pain, sob, hemoptysis, melena, hematochezia, hematuria, genital discharge, changing or concerning skin lesions, bleeding, bruising, loc, thoughts of self harm or SI  Past Medical History:  Diagnosis Date  . Cancer ([place])    melanoma R LE  . Chicken pox   . Heart murmur   . Migraines   . Varicose veins     Past Surgical History:  Procedure Laterality Date  . WISDOM TOOTH EXTRACTION      Family History  Problem Relation Age of Onset  . Hyperlipidemia Mother   . Arthritis Maternal Grandmother   . Diabetes Maternal Grandmother   . Breast cancer      maternal great grandmother    Social History   Social History  . Marital status: Married    Spouse name: N/A  . Number of children: N/A  . Years of education: N/A   Social History Main Topics  . Smoking status: Never Smoker  . Smokeless tobacco: Never Used  . Alcohol use Yes     Comment: occ - 1-2 drinks rarely  . Drug use: No  . Sexual activity: Yes     Comment: with spouse   Other Topics Concern  . 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  albuterol (PROVENTIL HFA;VENTOLIN HFA) 108 (90 Base) MCG/ACT inhaler, Inhale 2 puffs into the lungs every 6 (six) hours as needed for wheezing or shortness of breath., Disp: 1 Inhaler, Rfl: 0 .  Ascorbic Acid (VITAMIN C PO), Take by mouth., Disp: , Rfl:  .  fluticasone (FLONASE) 50 MCG/ACT nasal spray, Place 1 spray into both nostrils 2 (two) times daily., Disp: 16 g, Rfl: 3 .  HYDROcodone-acetaminophen (NORCO/VICODIN) 5-325 MG per tablet, Take 1 tablet by mouth every 6 (six) hours as needed for moderate pain. Monthly for menstrual cycle, Disp: , Rfl:  .  Multiple Vitamins-Minerals (ALIVE WOMENS ENERGY PO), Take by mouth daily., Disp: , Rfl:  .  naproxen sodium (ANAPROX) 220 MG tablet, Take 220 mg by mouth as needed., Disp: , Rfl:   EXAM:  Vitals:   12/19/16 0849  BP: 120/82  Pulse: 74  Temp: 98.8 F (37.1 C)    GENERAL: vitals reviewed and  listed below, alert, oriented, appears well hydrated and in no acute distress  HEENT: head atraumatic, PERRLA, normal appearance of eyes, ears, nose and mouth. moist mucus membranes.  NECK: supple, no masses or lymphadenopathy  LUNGS: clear to auscultation bilaterally, no rales, rhonchi or wheeze  CV: HRRR, no peripheral edema or cyanosis, normal pedal pulses  ABDOMEN: bowel sounds normal, soft, non tender to palpation, no masses, no rebound or guarding  SKIN: no rash or abnormal lesions, SK R breast  MS: normal gait, moves all extremities normally  NEURO: normal gait, speech and thought processing grossly intact, muscle tone grossly intact throughout  PSYCH: normal affect, pleasant and cooperative  ASSESSMENT AND PLAN:  Discussed  the following assessment and plan:  Encounter for preventive health examination - Plan: Lipid panel, Hemoglobin A1c, TSH, HIV antibody (with reflex)  Missed period  -will get urine preg and tsh for missed period, advised gyn follow up if persists in 2-3 weeks  -Discussed and advised all [place] preventive services health task force level A and B recommendations for age, sex and risks.  -Advised at least 150 minutes of exercise per week and a healthy diet with avoidance of (less then 1 serving per week) processed foods, white starches, red meat, fast foods and sweets and consisting of: * 5-9 servings of fresh fruits and vegetables (not corn or potatoes) *nuts and seeds, beans *olives and olive oil *lean meats such as fish and white chicken  *whole grains  -labs, studies and vaccines per orders this encounter  Orders Placed This Encounter  Procedures  . Lipid panel  . Hemoglobin A1c  . TSH  . HIV antibody (with reflex)    Patient advised to return to clinic immediately if symptoms worsen or persist or new concerns.  Patient Instructions  BEFORE YOU LEAVE: -urine pregnancy test -flu vaccine -follow up: yearly for CPE and as needed -procedure visit if she wishes for removal skin lesion  Vit D3 [phone] IU daily  We have ordered labs or studies at this visit. It can take up to 1-2 weeks for results and processing. IF results require follow up or explanation, we will call you with instructions. Clinically stable results will be released to your [facility]. If you have not heard from [place] or cannot find your results in [facility] in 2 weeks please contact our office at [phone].   If you are not yet signed up for [facility], please SIGN UP TODAY. We now offer online scheduling, same day appointments and extended hours. WHEN YOU DON'T FEEL YOUR BEST.Marland KitchenMarland KitchenWE ARE HERE TO HELP.  We recommend the following healthy lifestyle for LIFE: 1) Small portions.   Tip: eat off of a salad plate instead of a  dinner plate.  Tip: if you need more or a snack choose fruits, veggies and/or a handful of nuts or seeds.  2) Eat a healthy clean diet.  * Tip: Avoid (less then 1 serving per week): processed foods, sweets, sweetened drinks, white starches (rice, flour, bread, potatoes, pasta, etc), red meat, fast foods, butter  *Tip: CHOOSE instead   * 5-9 servings per day of fresh or frozen fruits and vegetables (but not corn, potatoes, bananas, canned or dried fruit)   *nuts and seeds, beans   *olives and olive oil   *small portions of lean meats such as fish and white chicken    *small portions of whole grains  3)Get at least 150 minutes of sweaty aerobic exercise per week.  4)Reduce stress - consider counseling, meditation  and relaxation to balance other aspects of your life.              No Follow-up on file.  Colin Benton R., DO

## 2016-12-19 ENCOUNTER — Ambulatory Visit (INDEPENDENT_AMBULATORY_CARE_PROVIDER_SITE_OTHER): Payer: Managed Care, Other (non HMO) | Admitting: Family Medicine

## 2016-12-19 ENCOUNTER — Encounter: Payer: Self-pay | Admitting: Family Medicine

## 2016-12-19 VITALS — BP 120/82 | HR 74 | Temp 98.8°F | Ht 62.75 in | Wt 211.5 lb

## 2016-12-19 DIAGNOSIS — Z0000 Encounter for general adult medical examination without abnormal findings: Secondary | ICD-10-CM | POA: Diagnosis not present

## 2016-12-19 DIAGNOSIS — Z23 Encounter for immunization: Secondary | ICD-10-CM

## 2016-12-19 DIAGNOSIS — N926 Irregular menstruation, unspecified: Secondary | ICD-10-CM | POA: Diagnosis not present

## 2016-12-19 LAB — HEMOGLOBIN A1C: Hgb A1c MFr Bld: 5.2 % (ref 4.6–6.5)

## 2016-12-19 LAB — LIPID PANEL
CHOL/HDL RATIO: 2
Cholesterol: 142 mg/dL (ref 0–200)
HDL: 65.1 mg/dL (ref >39.00)
LDL Cholesterol: 67 mg/dL (ref 0–99)
NonHDL: 76.62
TRIGLYCERIDES: 47 mg/dL (ref 0.0–149.0)
VLDL: 9.4 mg/dL (ref 0.0–40.0)

## 2016-12-19 LAB — TSH: TSH: 2.72 u[IU]/mL (ref 0.35–4.50)

## 2016-12-19 LAB — POCT URINE PREGNANCY: PREG TEST UR: NEGATIVE

## 2016-12-19 NOTE — Addendum Note (Signed)
Addended by: Agnes Lawrence on: 12/19/2016 09:54 AM   Modules accepted: Orders

## 2016-12-19 NOTE — Progress Notes (Signed)
Pre visit review using our clinic review tool, if applicable. No additional management support is needed unless otherwise documented below in the visit note. 

## 2016-12-19 NOTE — Patient Instructions (Signed)
BEFORE YOU LEAVE: -urine pregnancy test -flu vaccine -follow up: yearly for CPE and as needed -procedure visit if she wishes for removal skin lesion  Vit D3 [phone] IU daily  We have ordered labs or studies at this visit. It can take up to 1-2 weeks for results and processing. IF results require follow up or explanation, we will call you with instructions. Clinically stable results will be released to your [facility]. If you have not heard from [place] or cannot find your results in [facility] in 2 weeks please contact our office at [phone].   If you are not yet signed up for [facility], please SIGN UP TODAY. We now offer online scheduling, same day appointments and extended hours. WHEN YOU DON'T FEEL YOUR BEST.Marland KitchenMarland KitchenWE ARE HERE TO HELP.  We recommend the following healthy lifestyle for LIFE: 1) Small portions.   Tip: eat off of a salad plate instead of a dinner plate.  Tip: if you need more or a snack choose fruits, veggies and/or a handful of nuts or seeds.  2) Eat a healthy clean diet.  * Tip: Avoid (less then 1 serving per week): processed foods, sweets, sweetened drinks, white starches (rice, flour, bread, potatoes, pasta, etc), red meat, fast foods, butter  *Tip: CHOOSE instead   * 5-9 servings per day of fresh or frozen fruits and vegetables (but not corn, potatoes, bananas, canned or dried fruit)   *nuts and seeds, beans   *olives and olive oil   *small portions of lean meats such as fish and white chicken    *small portions of whole grains  3)Get at least 150 minutes of sweaty aerobic exercise per week.  4)Reduce stress - consider counseling, meditation and relaxation to balance other aspects of your life.

## 2016-12-20 LAB — HIV ANTIBODY (ROUTINE TESTING W REFLEX): HIV: NONREACTIVE

## 2016-12-23 ENCOUNTER — Telehealth: Payer: Self-pay | Admitting: Family Medicine

## 2016-12-23 NOTE — Telephone Encounter (Signed)
Pt would like a call back to go over her labs.  Pt has questions.

## 2016-12-23 NOTE — Telephone Encounter (Signed)
I called the pt and discussed the results with her.

## 2017-01-01 ENCOUNTER — Other Ambulatory Visit: Payer: Self-pay | Admitting: Family Medicine

## 2017-01-01 DIAGNOSIS — Z1231 Encounter for screening mammogram for malignant neoplasm of breast: Secondary | ICD-10-CM

## 2017-01-27 ENCOUNTER — Ambulatory Visit
Admission: RE | Admit: 2017-01-27 | Discharge: 2017-01-27 | Disposition: A | Discharge status: Home / Self Care | Payer: Managed Care, Other (non HMO) | Source: Ambulatory Visit | Attending: Family Medicine | Admitting: Family Medicine

## 2017-01-27 DIAGNOSIS — Z1231 Encounter for screening mammogram for malignant neoplasm of breast: Secondary | ICD-10-CM

## 2017-07-20 ENCOUNTER — Encounter: Payer: Self-pay | Admitting: Family Medicine

## 2017-07-20 ENCOUNTER — Ambulatory Visit (INDEPENDENT_AMBULATORY_CARE_PROVIDER_SITE_OTHER): Payer: Managed Care, Other (non HMO) | Admitting: Family Medicine

## 2017-07-20 VITALS — BP 112/80 | HR 67 | Temp 98.6°F | Ht 62.75 in | Wt 217.9 lb

## 2017-07-20 DIAGNOSIS — R300 Dysuria: Secondary | ICD-10-CM | POA: Diagnosis not present

## 2017-07-20 LAB — POCT URINALYSIS DIPSTICK
Bilirubin, UA: NEGATIVE
GLUCOSE UA: NEGATIVE
Ketones, UA: NEGATIVE
Leukocytes, UA: NEGATIVE
Nitrite, UA: NEGATIVE
Protein, UA: NEGATIVE
UROBILINOGEN UA: 0.2 U/dL
pH, UA: 6 (ref 5.0–8.0)

## 2017-07-20 LAB — URINALYSIS, MICROSCOPIC ONLY

## 2017-07-20 NOTE — Patient Instructions (Addendum)
BEFORE YOU LEAVE: -urine micro and culture  Drink plenty of water.  Follow up with your gynecologist as planned about the menstrual issues.  We have ordered labs or studies at this visit. It can take up to 1-2 weeks for results and processing. IF results require follow up or explanation, we will call you with instructions. Clinically stable results will be released to your [facility]. If you have not heard from [place] or cannot find your results in [facility] in 2 weeks please contact our office at [phone].  If you are not yet signed up for [facility], please consider signing up.  I hope you are feeling better soon! Follow up if worsening, new concerns or you are not improving with treatment.

## 2017-07-20 NOTE — Progress Notes (Signed)
HPI:  Acute visit for Dysuria: -started 3 days ago, now improving -symptoms include: dysuria, frequency, urgency -denies: fevers, malaise, flank pain, hematuria, vaginal discharge, pelvic/abd pain -seeing gyn for dysmenorrhea -FDLMP 07/02/17  ROS: See pertinent positives and negatives per HPI.  Past Medical History:  Diagnosis Date  . Cancer ([place])    melanoma R LE  . Chicken pox   . Heart murmur   . Migraines   . Varicose veins     Past Surgical History:  Procedure Laterality Date  . WISDOM TOOTH EXTRACTION      Family History  Problem Relation Age of Onset  . Hyperlipidemia Mother   . Arthritis Maternal Grandmother   . Diabetes Maternal Grandmother   . Breast cancer Unknown        maternal great grandmother    Social History   Social History  . Marital status: Married    Spouse name: N/A  . Number of children: N/A  . Years of education: N/A   Social History Main Topics  . Smoking status: Never Smoker  . Smokeless tobacco: Never Used  . Alcohol use Yes     Comment: occ - 1-2 drinks rarely  . Drug use: No  . Sexual activity: Yes     Comment: with spouse   Other Topics Concern  . 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  HYDROcodone-acetaminophen (NORCO/VICODIN) 5-325 MG per tablet, Take 1 tablet by mouth every 6 (six) hours as needed for moderate pain. Monthly for menstrual cycle, Disp: , Rfl:  .  Multiple Vitamins-Minerals (ALIVE WOMENS ENERGY PO), Take by mouth daily., Disp: , Rfl:  .  naproxen sodium (ANAPROX) 220 MG tablet, Take 220 mg by mouth as needed., Disp: , Rfl:   EXAM:  Vitals:   07/20/17 1040  BP:  112/80  Pulse: 67  Temp: 98.6 F (37 C)    Body mass index is 38.91 kg/m.  GENERAL: vitals reviewed and listed above, alert, oriented, appears well hydrated and in no acute distress  HEENT: atraumatic, conjunttiva clear, no obvious abnormalities on inspection of external nose and ears  NECK: no obvious masses on inspection  LUNGS: clear to auscultation bilaterally, no wheezes, rales or rhonchi, good air movement  CV: HRRR, no peripheral edema  ABD:BS+, soft, NTTP, no CVA TTP  MS: moves all extremities without noticeable abnormality  PSYCH: pleasant and cooperative, no obvious depression or anxiety  ASSESSMENT AND PLAN:  Discussed the following assessment and plan:  Dysuria - Plan: POC Urinalysis Dipstick  -udip with bld to confirm and quant, micro and culture pending -f/u with gyn about dysmenorrhea planned -Patient advised to return or notify a doctor immediately if symptoms worsen or persist or new concerns arise.  Patient Instructions  BEFORE YOU LEAVE: -urine micro and culture  Drink plenty of water.  Follow up with your gynecologist as planned about the menstrual issues.  We have ordered labs or studies at this visit. It can take up to 1-2 weeks for results and processing. IF results require follow up or  explanation, we will call you with instructions. Clinically stable results will be released to your [facility]. If you have not heard from [place] or cannot find your results in [facility] in 2 weeks please contact our office at [phone].  If you are not yet signed up for [facility], please consider signing up.  I hope you are feeling better soon! Follow up if worsening, new concerns or you are not improving with treatment.           Colin Benton R., DO

## 2017-07-20 NOTE — Addendum Note (Signed)
Addended by: Agnes Lawrence on: 07/20/2017 11:15 AM   Modules accepted: Orders

## 2017-07-20 NOTE — Addendum Note (Signed)
Addended by: Tomi Likens on: 07/20/2017 01:25 PM   Modules accepted: Orders

## 2017-07-21 MED ORDER — NITROFURANTOIN MONOHYD MACRO 100 MG PO CAPS: 100.0000 mg | ORAL_CAPSULE | Freq: Two times a day (BID) | ORAL | 0 refills | Status: DC

## 2017-07-21 NOTE — Addendum Note (Signed)
Addended by: Agnes Lawrence on: 07/21/2017 03:47 PM   Modules accepted: Orders

## 2017-07-23 LAB — URINE CULTURE
MICRO NUMBER:: 80993774
SPECIMEN QUALITY: ADEQUATE

## 2017-07-30 ENCOUNTER — Encounter: Payer: Self-pay | Admitting: Family Medicine

## 2017-10-21 LAB — HM PAP SMEAR

## 2017-11-19 ENCOUNTER — Encounter: Payer: Self-pay | Admitting: Family Medicine

## 2017-12-25 ENCOUNTER — Other Ambulatory Visit: Payer: Self-pay | Admitting: Family Medicine

## 2017-12-25 DIAGNOSIS — Z1231 Encounter for screening mammogram for malignant neoplasm of breast: Secondary | ICD-10-CM

## 2018-01-12 ENCOUNTER — Ambulatory Visit: Payer: Managed Care, Other (non HMO)

## 2018-01-29 ENCOUNTER — Ambulatory Visit
Admission: RE | Admit: 2018-01-29 | Discharge: 2018-01-29 | Disposition: A | Discharge status: Home / Self Care | Payer: Managed Care, Other (non HMO) | Source: Ambulatory Visit | Attending: Family Medicine | Admitting: Family Medicine

## 2018-01-29 DIAGNOSIS — Z1231 Encounter for screening mammogram for malignant neoplasm of breast: Secondary | ICD-10-CM

## 2018-05-18 ENCOUNTER — Encounter: Payer: Self-pay | Admitting: Family Medicine

## 2018-06-01 NOTE — Progress Notes (Signed)
HPI:  Using dictation device. Unfortunately this device frequently misinterprets words/phrases.  Here for CPE:  -Concerns and/or follow up today:  She has multiple new concerns she would like to address today and understands and has been advised that insurance sometimes does not cover both a physical and an OV for problems in the same day. She still wishes to address today.  Weight gain/Obesity: -she admits to needed to eat better and get more exercise -her husband wants them to do the keto diet, but she is worried about eating "all that meat" -they had been eating out and eating a lot of fast food, now regular exercise -fasting for diabetes and cholesterol screening today -no hx HTn, diabetes, hld or OA, does have ankle pain - see below  Chronic cough/PNd: -for > 1 year -mucus in throat and has to cough it out -some occ nasal congestion -no wt loss, reported SOB, DOE or hemotysis or gerd  L ankle pain: -x2-3 weeks -thinks felt it crack or pop at one point, but no known injury or  sig pain at the time -did change shoes around same time -has had some medial ankle pain and some swelling intermittently  -Diet: variety of foods, balance and well rounded, larger portion sizes -Exercise: no regular exercise -Taking folic acid, vitamin D or calcium: no -Diabetes and Dyslipidemia Screening: fasting for labs -Vaccines: see vaccine section EPIC -pap history: saw gyn in the past, last pap 13/2018 per [facility] at [facility] ob -FDLMP: see nursing notes -sexual activity: yes, female partner, no new partners -wants STI testing (Hep C if born [birth year]-65): no -FH breast, colon or ovarian ca: see FH Last mammogram: 3/19 birads 1 Last colon cancer screening: n/a Breast Ca Risk Assessment: see family history and pt history DEXA (>/= 65): n/a  -Alcohol, Tobacco, drug use: see social history  Review of Systems - no fevers, unintentional weight loss, vision loss, hearing loss, chest pain, sob, hemoptysis,  melena, hematochezia, hematuria, genital discharge, changing or concerning skin lesions, bleeding, bruising, loc, thoughts of self harm or SI  Past Medical History:  Diagnosis Date  . Cancer ([place])    melanoma R LE  . Chicken pox   . Heart murmur   . Migraines   . Varicose veins     Past Surgical History:  Procedure Laterality Date  . WISDOM TOOTH EXTRACTION      Family History  Problem Relation Age of Onset  . Hyperlipidemia Mother   . Arthritis Maternal Grandmother   . Diabetes Maternal Grandmother   . Breast cancer Unknown        maternal great grandmother    Social History   Socioeconomic History  . Marital status: Married    Spouse name: Not on file  . Number of children: Not on file  . Years of education: Not on file  . Highest education level: Not on file  Occupational History  . Not on file  Social Needs  . Financial resource strain: Not on file  . Food insecurity:    Worry: Not on file    Inability: Not on file  . Transportation needs:    Medical: Not on file    Non-medical: Not on file  Tobacco Use  . Smoking status: Never Smoker  . Smokeless tobacco: Never Used  Substance and Sexual Activity  . Alcohol use: Yes    Comment: occ - 1-2 drinks rarely  . Drug use: No  . Sexual activity: Yes    Comment: with spouse  Lifestyle  . Physical activity:    Days per week: Not on file    Minutes per session: Not on file  . Stress: Not on file  Relationships  . Social connections:    Talks on phone: Not on file    Gets together: Not on file    Attends religious service: Not on file    Active member of club or organization: Not on file    Attends meetings of clubs or organizations: Not on file    Relationship status: Not on file  Other Topics Concern  . 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  naproxen sodium (ANAPROX) 220 MG tablet, Take 220 mg by mouth as needed., Disp: , Rfl:  .  fluticasone (FLONASE) 50 MCG/ACT nasal spray, Place 2 sprays into both nostrils daily., Disp: 16 g, Rfl: 6  EXAM:  Vitals:   06/03/18 0842  BP: 100/72  Pulse: 67  Temp: 98.3 F (36.8 C)   Body mass index is 38.46 kg/m.  GENERAL: vitals reviewed and listed below, alert, oriented, appears well hydrated and in no acute distress  HEENT: head atraumatic, PERRLA, normal appearance of eyes, ears, nose and mouth. moist mucus membranes. Normal appearance of ear canals and TMs, clear nasal congestion, mild post oropharyngeal erythema with PND, no tonsillar edema or exudate, no sinus TTP  NECK: supple, no masses or lymphadenopathy  LUNGS: clear to auscultation bilaterally, no rales, rhonchi or wheeze  CV: HRRR, no peripheral edema or cyanosis, normal pedal pulses  ABDOMEN: bowel sounds normal, soft, non tender to palpation, no masses, no rebound or guarding  GU/BREAST: does with gyn  SKIN: no rash or abnormal lesions  MS: normal gait, moves all extremities normally, mild TTP over medial ankle tendons L, no redness, warmth or sig effusion today, no bony TTP, no weakness in feet/ankles, neg talar tilt test and drawer test, normal cap refill  NEURO: normal gait, speech and thought processing grossly intact, muscle tone grossly intact throughout  PSYCH: normal affect, pleasant and cooperative  ASSESSMENT AND PLAN:  Discussed the following assessment and plan: Additional time beyond physical spent addressing 3 concerns today.  PREVENTIVE EXAM: -Discussed and advised all [place] preventive services health task force level A and  B recommendations for age, sex and risks. -ensured all health maintenance measures utd or advised -fasting labs, studies and vaccines per orders this encounter  Screening for depression -neg  Obesity (BMI 35.0-39.9 without comorbidity) - Plan: Lipid panel, Hemoglobin A1c -discussed options for management at length -10-20lb wt reduction over next 3-6 months and lifestyle commitment to healthy lifestyle advised -Advised at least 150 minutes of exercise per week and a healthy diet with avoidance of (less then 1 serving per week) processed foods, white starches, red meat, fast foods and sweets and consisting of: * 5-9 servings of fresh fruits and vegetables (not corn or potatoes) *nuts and seeds, beans *olives and olive oil *lean meats such  as fish and white chicken  *whole grains  Acute left ankle pain -query strain/footwear induced/? Wt contributing -discussed options -ice/alphabet exercises/aleve -wt reduction, consider gait analysis - offered at [employer] -follow up 1 month   PND (post-nasal drip) Chronic cough -we discussed possible serious and likely etiologies, workup and treatment, treatment risks and return precautions - suspect pnd from allergies or silent reflux as most likely -after this discussion, Carolie opted for trial ins, follow up 1 month -follow up advised 1 month -of course, we advised Collin  to return or notify a doctor immediately if symptoms worsen or persist or new concerns arise.    Patient Instructions  BEFORE YOU LEAVE: -labs -follow up: 1 months  For the cough/mucus: -start the flonase and use 2 sprays each nostril daily -follow up in 1 month  For the ankle pain: -ice and aleve when needed -consider gait analysis for shoes -alphabet exercises daily -follow up in 1 month  For weight reduction: -shoot for a 10-20 lb weight reduction over the next 3-6 months -eat a healthy low sugar diet (see below) and get at least 150 minutes of aerobic  exercise per week -draw up and write down a 5 year plan for healthy eating and regular exercise -please consider keeping a detailed diet/beverage/exercise journal and scheduling follow up hear ever 2-4 weeks to review and assist with eating healthy   We recommend the following healthy lifestyle for LIFE: 1) Small portions. But, make sure to get regular (at least 3 per day), healthy meals and small healthy snacks if needed.  2) Eat a healthy clean diet.   TRY TO EAT: -at least 5-7 servings of low sugar, colorful, and nutrient rich vegetables per day (not corn, potatoes or bananas.) -berries are the best choice if you wish to eat fruit (only eat small amounts if trying to reduce weight)  -lean meets (fish, white meat of chicken or [place]) -vegan proteins for some meals - beans or tofu, whole grains, nuts and seeds -Replace bad fats with good fats - good fats include: fish, nuts and seeds, canola oil, olive oil -small amounts of low fat or non fat dairy -small amounts of100 % whole grains - check the lables -drink plenty of water  AVOID: -SUGAR, sweets, anything with added sugar, corn syrup or sweeteners - must read labels as even foods advertised as "healthy" often are loaded with sugar -if you must have a sweetener, small amounts of stevia may be best -sweetened beverages and artificially sweetened beverages -simple starches (rice, bread, potatoes, pasta, chips, etc - small amounts of 100% whole grains are ok) -red meat, pork, butter -fried foods, fast food, processed food, excessive dairy, eggs and coconut.  3)Get at least 150 minutes of sweaty aerobic exercise per week.  4)Reduce stress - consider counseling, meditation and relaxation to balance other aspects of your life.  We have ordered labs or studies at this visit. It can take up to 1-2 weeks for results and processing. IF results require follow up or explanation, we will call you with instructions. Clinically stable results  will be released to your [facility]. If you have not heard from [place] or cannot find your results in [facility] in 2 weeks please contact our office at [phone].  If you are not yet signed up for [facility], please consider signing up.    Preventive Care 40-64 Years, Female Preventive care refers to lifestyle choices and visits with your health care provider that can promote health and wellness. What  does preventive care include?  A yearly physical exam. This is also called an annual well check.  Dental exams once or twice a year.  Routine eye exams. Ask your health care provider how often you should have your eyes checked.  Personal lifestyle choices, including: ? Daily care of your teeth and gums. ? Regular physical activity. ? Eating a healthy diet. ? Avoiding tobacco and drug use. ? Limiting alcohol use. ? Practicing safe sex. ? Taking vitamin and mineral supplements as recommended by your health care provider. What happens during an annual well check? The services and screenings done by your health care provider during your annual well check will depend on your age, overall health, lifestyle risk factors, and family history of disease. Counseling Your health care provider may ask you questions about your:  Alcohol use.  Tobacco use.  Drug use.  Emotional well-being.  Home and relationship well-being.  Sexual activity.  Eating habits.  Work and work Statistician.  Method of birth control.  Menstrual cycle.  Pregnancy history.  Screening You may have the following tests or measurements:  Height, weight, and BMI.  Blood pressure.  Lipid and cholesterol levels. These may be checked every 5 years, or more frequently if you are over 49 years old.  Skin check.  Lung cancer screening. You may have this screening every year starting at age 19 if you have a 30-pack-year history of smoking and currently smoke or have quit within the past 15 years.  Fecal occult blood  test (FOBT) of the stool. You may have this test every year starting at age 17.  Flexible sigmoidoscopy or colonoscopy. You may have a sigmoidoscopy every 5 years or a colonoscopy every 10 years starting at age 76.  Hepatitis C blood test.  Hepatitis B blood test.  Sexually transmitted disease (STD) testing.  Diabetes screening. This is done by checking your blood sugar (glucose) after you have not eaten for a while (fasting). You may have this done every 1-3 years.  Mammogram. This may be done every 1-2 years. Talk to your health care provider about when you should start having regular mammograms. This may depend on whether you have a family history of breast cancer.  BRCA-related cancer screening. This may be done if you have a family history of breast, ovarian, tubal, or peritoneal cancers.  Pelvic exam and Pap test. This may be done every 3 years starting at age 59. Starting at age 30, this may be done every 5 years if you have a Pap test in combination with an HPV test.  Bone density scan. This is done to screen for osteoporosis. You may have this scan if you are at high risk for osteoporosis.  Discuss your test results, treatment options, and if necessary, the need for more tests with your health care provider. Vaccines Your health care provider may recommend certain vaccines, such as:  Influenza vaccine. This is recommended every year.  Tetanus, diphtheria, and acellular pertussis (Tdap, Td) vaccine. You may need a Td booster every 10 years.  Varicella vaccine. You may need this if you have not been vaccinated.  Zoster vaccine. You may need this after age 81.  Measles, mumps, and rubella (MMR) vaccine. You may need at least one dose of MMR if you were born in 1957 or later. You may also need a second dose.  Pneumococcal 13-valent conjugate (PCV13) vaccine. You may need this if you have certain conditions and were not previously vaccinated.  Pneumococcal polysaccharide  (  PPSV23) vaccine. You may need one or two doses if you smoke cigarettes or if you have certain conditions.  Meningococcal vaccine. You may need this if you have certain conditions.  Hepatitis A vaccine. You may need this if you have certain conditions or if you travel or work in places where you may be exposed to hepatitis A.  Hepatitis B vaccine. You may need this if you have certain conditions or if you travel or work in places where you may be exposed to hepatitis B.  Haemophilus influenzae type b (Hib) vaccine. You may need this if you have certain conditions.  Talk to your health care provider about which screenings and vaccines you need and how often you need them. This information is not intended to replace advice given to you by your health care provider. Make sure you discuss any questions you have with your health care provider. Document Released: 11/23/2015 Document Revised: 07/16/2016 Document Reviewed: 08/28/2015 Elsevier Interactive Patient Education  2018 [employer].           No follow-ups on file.  Lucretia Kern, DO

## 2018-06-03 ENCOUNTER — Ambulatory Visit (INDEPENDENT_AMBULATORY_CARE_PROVIDER_SITE_OTHER): Payer: Managed Care, Other (non HMO) | Admitting: Family Medicine

## 2018-06-03 ENCOUNTER — Encounter: Payer: Self-pay | Admitting: Family Medicine

## 2018-06-03 VITALS — BP 100/72 | HR 67 | Temp 98.3°F | Ht 63.75 in | Wt 222.3 lb

## 2018-06-03 DIAGNOSIS — E669 Obesity, unspecified: Secondary | ICD-10-CM | POA: Diagnosis not present

## 2018-06-03 DIAGNOSIS — M25572 Pain in left ankle and joints of left foot: Secondary | ICD-10-CM | POA: Diagnosis not present

## 2018-06-03 DIAGNOSIS — Z1331 Encounter for screening for depression: Secondary | ICD-10-CM | POA: Diagnosis not present

## 2018-06-03 DIAGNOSIS — Z0000 Encounter for general adult medical examination without abnormal findings: Secondary | ICD-10-CM | POA: Diagnosis not present

## 2018-06-03 DIAGNOSIS — R05 Cough: Secondary | ICD-10-CM | POA: Diagnosis not present

## 2018-06-03 DIAGNOSIS — R053 Chronic cough: Secondary | ICD-10-CM

## 2018-06-03 DIAGNOSIS — R0982 Postnasal drip: Secondary | ICD-10-CM

## 2018-06-03 LAB — HEMOGLOBIN A1C: HEMOGLOBIN A1C: 5.3 % (ref 4.6–6.5)

## 2018-06-03 LAB — LIPID PANEL
CHOLESTEROL: 148 mg/dL (ref 0–200)
HDL: 60.1 mg/dL (ref >39.00)
LDL Cholesterol: 76 mg/dL (ref 0–99)
NonHDL: 87.58
Total CHOL/HDL Ratio: 2
Triglycerides: 59 mg/dL (ref 0.0–149.0)
VLDL: 11.8 mg/dL (ref 0.0–40.0)

## 2018-06-03 MED ORDER — FLUTICASONE PROPIONATE 50 MCG/ACT NA SUSP: 2.0000 | Freq: Every day | NASAL | 6 refills | Status: DC

## 2018-06-03 NOTE — Patient Instructions (Signed)
BEFORE YOU LEAVE: -labs -follow up: 1 months  For the cough/mucus: -start the flonase and use 2 sprays each nostril daily -follow up in 1 month  For the ankle pain: -ice and aleve when needed -consider gait analysis for shoes -alphabet exercises daily -follow up in 1 month  For weight reduction: -shoot for a 10-20 lb weight reduction over the next 3-6 months -eat a healthy low sugar diet (see below) and get at least 150 minutes of aerobic exercise per week -draw up and write down a 5 year plan for healthy eating and regular exercise -please consider keeping a detailed diet/beverage/exercise journal and scheduling follow up hear ever 2-4 weeks to review and assist with eating healthy   We recommend the following healthy lifestyle for LIFE: 1) Small portions. But, make sure to get regular (at least 3 per day), healthy meals and small healthy snacks if needed.  2) Eat a healthy clean diet.   TRY TO EAT: -at least 5-7 servings of low sugar, colorful, and nutrient rich vegetables per day (not corn, potatoes or bananas.) -berries are the best choice if you wish to eat fruit (only eat small amounts if trying to reduce weight)  -lean meets (fish, white meat of chicken or [place]) -vegan proteins for some meals - beans or tofu, whole grains, nuts and seeds -Replace bad fats with good fats - good fats include: fish, nuts and seeds, canola oil, olive oil -small amounts of low fat or non fat dairy -small amounts of100 % whole grains - check the lables -drink plenty of water  AVOID: -SUGAR, sweets, anything with added sugar, corn syrup or sweeteners - must read labels as even foods advertised as "healthy" often are loaded with sugar -if you must have a sweetener, small amounts of stevia may be best -sweetened beverages and artificially sweetened beverages -simple starches (rice, bread, potatoes, pasta, chips, etc - small amounts of 100% whole grains are ok) -red meat, pork, butter -fried  foods, fast food, processed food, excessive dairy, eggs and coconut.  3)Get at least 150 minutes of sweaty aerobic exercise per week.  4)Reduce stress - consider counseling, meditation and relaxation to balance other aspects of your life.  We have ordered labs or studies at this visit. It can take up to 1-2 weeks for results and processing. IF results require follow up or explanation, we will call you with instructions. Clinically stable results will be released to your [facility]. If you have not heard from [place] or cannot find your results in [facility] in 2 weeks please contact our office at [phone].  If you are not yet signed up for [facility], please consider signing up.    Preventive Care 40-64 Years, Female Preventive care refers to lifestyle choices and visits with your health care provider that can promote health and wellness. What does preventive care include?  A yearly physical exam. This is also called an annual well check.  Dental exams once or twice a year.  Routine eye exams. Ask your health care provider how often you should have your eyes checked.  Personal lifestyle choices, including: ? Daily care of your teeth and gums. ? Regular physical activity. ? Eating a healthy diet. ? Avoiding tobacco and drug use. ? Limiting alcohol use. ? Practicing safe sex. ? Taking vitamin and mineral supplements as recommended by your health care provider. What happens during an annual well check? The services and screenings done by your health care provider during your annual well check will depend  on your age, overall health, lifestyle risk factors, and family history of disease. Counseling Your health care provider may ask you questions about your:  Alcohol use.  Tobacco use.  Drug use.  Emotional well-being.  Home and relationship well-being.  Sexual activity.  Eating habits.  Work and work Statistician.  Method of birth control.  Menstrual cycle.  Pregnancy  history.  Screening You may have the following tests or measurements:  Height, weight, and BMI.  Blood pressure.  Lipid and cholesterol levels. These may be checked every 5 years, or more frequently if you are over 55 years old.  Skin check.  Lung cancer screening. You may have this screening every year starting at age 56 if you have a 30-pack-year history of smoking and currently smoke or have quit within the past 15 years.  Fecal occult blood test (FOBT) of the stool. You may have this test every year starting at age 56.  Flexible sigmoidoscopy or colonoscopy. You may have a sigmoidoscopy every 5 years or a colonoscopy every 10 years starting at age 85.  Hepatitis C blood test.  Hepatitis B blood test.  Sexually transmitted disease (STD) testing.  Diabetes screening. This is done by checking your blood sugar (glucose) after you have not eaten for a while (fasting). You may have this done every 1-3 years.  Mammogram. This may be done every 1-2 years. Talk to your health care provider about when you should start having regular mammograms. This may depend on whether you have a family history of breast cancer.  BRCA-related cancer screening. This may be done if you have a family history of breast, ovarian, tubal, or peritoneal cancers.  Pelvic exam and Pap test. This may be done every 3 years starting at age 50. Starting at age 75, this may be done every 5 years if you have a Pap test in combination with an HPV test.  Bone density scan. This is done to screen for osteoporosis. You may have this scan if you are at high risk for osteoporosis.  Discuss your test results, treatment options, and if necessary, the need for more tests with your health care provider. Vaccines Your health care provider may recommend certain vaccines, such as:  Influenza vaccine. This is recommended every year.  Tetanus, diphtheria, and acellular pertussis (Tdap, Td) vaccine. You may need a Td booster  every 10 years.  Varicella vaccine. You may need this if you have not been vaccinated.  Zoster vaccine. You may need this after age 57.  Measles, mumps, and rubella (MMR) vaccine. You may need at least one dose of MMR if you were born in 1957 or later. You may also need a second dose.  Pneumococcal 13-valent conjugate (PCV13) vaccine. You may need this if you have certain conditions and were not previously vaccinated.  Pneumococcal polysaccharide (PPSV23) vaccine. You may need one or two doses if you smoke cigarettes or if you have certain conditions.  Meningococcal vaccine. You may need this if you have certain conditions.  Hepatitis A vaccine. You may need this if you have certain conditions or if you travel or work in places where you may be exposed to hepatitis A.  Hepatitis B vaccine. You may need this if you have certain conditions or if you travel or work in places where you may be exposed to hepatitis B.  Haemophilus influenzae type b (Hib) vaccine. You may need this if you have certain conditions.  Talk to your health care provider about which screenings  and vaccines you need and how often you need them. This information is not intended to replace advice given to you by your health care provider. Make sure you discuss any questions you have with your health care provider. Document Released: 11/23/2015 Document Revised: 07/16/2016 Document Reviewed: 08/28/2015 Elsevier Interactive Patient Education  [employer].

## 2019-01-03 ENCOUNTER — Other Ambulatory Visit: Payer: Self-pay | Admitting: Family Medicine

## 2019-01-03 DIAGNOSIS — Z1231 Encounter for screening mammogram for malignant neoplasm of breast: Secondary | ICD-10-CM

## 2019-02-02 ENCOUNTER — Ambulatory Visit: Payer: Managed Care, Other (non HMO)

## 2019-03-02 ENCOUNTER — Ambulatory Visit: Payer: Managed Care, Other (non HMO)

## 2019-04-21 ENCOUNTER — Ambulatory Visit
Admission: RE | Admit: 2019-04-21 | Discharge: 2019-04-21 | Disposition: A | Discharge status: Home / Self Care | Payer: Managed Care, Other (non HMO) | Source: Ambulatory Visit | Attending: Family Medicine | Admitting: Family Medicine

## 2019-04-21 ENCOUNTER — Other Ambulatory Visit: Payer: Self-pay

## 2019-04-21 DIAGNOSIS — Z1231 Encounter for screening mammogram for malignant neoplasm of breast: Secondary | ICD-10-CM

## 2019-12-28 ENCOUNTER — Other Ambulatory Visit: Payer: Self-pay | Admitting: Family Medicine

## 2019-12-28 DIAGNOSIS — Z1231 Encounter for screening mammogram for malignant neoplasm of breast: Secondary | ICD-10-CM

## 2020-04-24 ENCOUNTER — Other Ambulatory Visit: Payer: Self-pay

## 2020-04-24 ENCOUNTER — Ambulatory Visit
Admission: RE | Admit: 2020-04-24 | Discharge: 2020-04-24 | Disposition: A | Discharge status: Home / Self Care | Payer: Managed Care, Other (non HMO) | Source: Ambulatory Visit | Attending: Family Medicine | Admitting: Family Medicine

## 2020-04-24 DIAGNOSIS — Z1231 Encounter for screening mammogram for malignant neoplasm of breast: Secondary | ICD-10-CM

## 2020-04-26 ENCOUNTER — Other Ambulatory Visit: Payer: Self-pay | Admitting: Family Medicine

## 2020-04-26 DIAGNOSIS — R928 Other abnormal and inconclusive findings on diagnostic imaging of breast: Secondary | ICD-10-CM

## 2020-05-03 ENCOUNTER — Other Ambulatory Visit: Payer: Self-pay

## 2020-05-03 ENCOUNTER — Ambulatory Visit
Admission: RE | Admit: 2020-05-03 | Discharge: 2020-05-03 | Disposition: A | Discharge status: Home / Self Care | Payer: Managed Care, Other (non HMO) | Source: Ambulatory Visit | Attending: Family Medicine | Admitting: Family Medicine

## 2020-05-03 DIAGNOSIS — R928 Other abnormal and inconclusive findings on diagnostic imaging of breast: Secondary | ICD-10-CM

## 2020-06-08 ENCOUNTER — Encounter: Payer: Self-pay | Admitting: Family Medicine

## 2020-06-08 ENCOUNTER — Other Ambulatory Visit: Payer: Self-pay | Admitting: Family Medicine

## 2020-06-08 ENCOUNTER — Other Ambulatory Visit: Payer: Self-pay

## 2020-06-08 ENCOUNTER — Ambulatory Visit: Payer: Managed Care, Other (non HMO) | Admitting: Family Medicine

## 2020-06-08 ENCOUNTER — Telehealth: Payer: Self-pay | Admitting: Radiology

## 2020-06-08 VITALS — BP 120/62 | HR 69 | Temp 98.3°F | Ht 63.75 in | Wt 199.5 lb

## 2020-06-08 DIAGNOSIS — Z13220 Encounter for screening for lipoid disorders: Secondary | ICD-10-CM

## 2020-06-08 DIAGNOSIS — Z131 Encounter for screening for diabetes mellitus: Secondary | ICD-10-CM

## 2020-06-08 DIAGNOSIS — R079 Chest pain, unspecified: Secondary | ICD-10-CM

## 2020-06-08 DIAGNOSIS — R300 Dysuria: Secondary | ICD-10-CM | POA: Diagnosis not present

## 2020-06-08 DIAGNOSIS — R002 Palpitations: Secondary | ICD-10-CM | POA: Diagnosis not present

## 2020-06-08 NOTE — Progress Notes (Signed)
Kayla Mccall DOB: 26-Oct-1976 Encounter date: 06/08/2020  This is a 44 y.o. female who presents to establish care. Chief Complaint  Patient presents with  . Establish Care    History of present illness: Last visit with Dr.Kim was 06/03/18.   Follows with gyn (visit last month). Missed period at one point last year; had 2 last month. Gyn had her schedule to come back and do [place]. Wanted her to come back in Sept due to cyst left ovary (not concerning but needed f/u).   About 6 yrs ago would feel heart beating faster. Over the years would intermittently do that but feels like it has been worse in recent year. She is working on regular exercise and healthier eating with husband. Has noted that chest on left side is hurting like needles - can take breath away - happens when lying but other day happened with walking and had to stop. Also getting intermittent quick beating - less than a minute. Needle sensation doesn't last long, but one night had to sit up and take deep breathes- lasted longer maybe ten minutes (longest episodes). Has felt this to some degree daily. Yesterday didn't feel anything. This week felt like she was getting uti and started azo.   This morning felt like urinary sx were better, but yesterday had urgency and wasn't able to go.   Does feel like it is hard to get deep breathe with needle sensation occurs. Not dizzy, light headed. Has had some night sweats and has had some hot flashes.   Past Medical History:  Diagnosis Date  . Cancer ([place])    melanoma R LE  . Chicken pox   . Heart murmur   . Migraines   . Varicose veins    Past Surgical History:  Procedure Laterality Date  . WISDOM TOOTH EXTRACTION     No Known Allergies Current Meds  Medication Sig  . Multiple Vitamins-Minerals (MULTIVITAMIN ADULTS PO) Take by mouth.  . Naproxen Sodium (ALEVE PO) Take by mouth as needed (menstrual cramps).  . Phenazopyridine HCl (AZO TABS PO) Take by mouth.   Social History    Tobacco Use  . Smoking status: Never Smoker  . Smokeless tobacco: Never Used  Substance Use Topics  . Alcohol use: Yes    Comment: occ - 1-2 drinks rarely   Family History  Problem Relation Age of Onset  . Hyperlipidemia Mother   . Diabetes Mother   . Other Father        suicide  . Arthritis Maternal Grandmother   . Diabetes Maternal Grandmother   . Breast cancer Other        maternal great grandmother  . Heart murmur Brother   . Healthy Half-Brother   . Healthy Half-Sister      Review of Systems  Constitutional: Negative for fatigue.  Respiratory: Positive for shortness of breath. Negative for chest tightness and wheezing.   Cardiovascular: Positive for chest pain (see hpi), palpitations and leg swelling (left ankle).  Genitourinary: Positive for difficulty urinating and urgency. Negative for flank pain.    Objective:  BP (!) 120/62 (BP Location: Left Arm, Patient Position: Sitting, Cuff Size: Large)   Pulse 69   Temp 98.3 F (36.8 C) (Oral)   Ht 5' 3.75" (1.619 m)   Wt 199 lb 8 oz (90.5 kg)   BMI 34.51 kg/m   Weight: 199 lb 8 oz (90.5 kg)   BP Readings from Last 3 Encounters:  06/08/20 (!) 120/62  06/03/18 100/72  07/20/17 112/80   Wt Readings from Last 3 Encounters:  06/08/20 199 lb 8 oz (90.5 kg)  06/03/18 222 lb 4.8 oz (100.8 kg)  07/20/17 217 lb 14.4 oz (98.8 kg)    Physical Exam Constitutional:      General: She is not in acute distress.    Appearance: She is well-developed.  Cardiovascular:     Rate and Rhythm: Normal rate and regular rhythm.     Heart sounds: Normal heart sounds. No murmur heard.  No friction rub.  Pulmonary:     Effort: Pulmonary effort is normal. No respiratory distress.     Breath sounds: Normal breath sounds. No wheezing or rales.  Musculoskeletal:     Right lower leg: No edema.     Left lower leg: No edema.  Neurological:     Mental Status: She is alert and oriented to person, place, and time.  Psychiatric:         Behavior: Behavior normal.     Assessment/Plan: 1. Chest pain, unspecified type ekg normal sinus rhythm; without acute changes. - EKG 12-Lead  2. Palpitations Worsening, newer. EKG stable. Start with bloodwork and will get monitor.  - EKG 12-Lead - CBC with Differential/Platelet; Future - Comprehensive metabolic panel; Future - TSH; Future - T4, free; Future - T3, free; Future - Cardiac event monitor; Future  3. Dysuria - Urine Culture; Future  4. Screening for diabetes mellitus - Hemoglobin A1c; Future  5. Lipid screening - Lipid panel; Future  Return for pending bloodwork and monitor.  Micheline Rough, MD

## 2020-06-08 NOTE — Telephone Encounter (Signed)
Enrolled patient for a 30 day [employer] Event Monitor to be mailed to patients home. Brief instructions were gone over with the patient and she knows to expect the monitor to arrive in 4-5 days.

## 2020-06-10 LAB — T3, FREE: T3, Free: 2.8 pg/mL (ref 2.3–4.2)

## 2020-06-10 LAB — CBC WITH DIFFERENTIAL/PLATELET
Absolute Monocytes: 504 cells/uL (ref 200–950)
Basophils Absolute: 21 cells/uL (ref 0–200)
Basophils Relative: 0.3 %
Eosinophils Absolute: 48 cells/uL (ref 15–500)
Eosinophils Relative: 0.7 %
HCT: 40 % (ref 35.0–45.0)
Hemoglobin: 13.6 g/dL (ref 11.7–15.5)
Lymphs Abs: 1318 cells/uL (ref 850–3900)
MCH: 30.6 pg (ref 27.0–33.0)
MCHC: 34 g/dL (ref 32.0–36.0)
MCV: 89.9 fL (ref 80.0–100.0)
MPV: 10.3 fL (ref 7.5–12.5)
Monocytes Relative: 7.3 %
Neutro Abs: 5009 cells/uL (ref 1500–7800)
Neutrophils Relative %: 72.6 %
Platelets: 322 10*3/uL (ref 140–400)
RBC: 4.45 10*6/uL (ref 3.80–5.10)
RDW: 12.3 % (ref 11.0–15.0)
Total Lymphocyte: 19.1 %
WBC: 6.9 10*3/uL (ref 3.8–10.8)

## 2020-06-10 LAB — URINE CULTURE
MICRO NUMBER:: 10770014
SPECIMEN QUALITY:: ADEQUATE

## 2020-06-10 LAB — COMPREHENSIVE METABOLIC PANEL
AG Ratio: 1.8 (calc) (ref 1.0–2.5)
ALT: 8 U/L (ref 6–29)
AST: 12 U/L (ref 10–30)
Albumin: 4.5 g/dL (ref 3.6–5.1)
Alkaline phosphatase (APISO): 53 U/L (ref 31–125)
BUN: 12 mg/dL (ref 7–25)
CO2: 27 mmol/L (ref 20–32)
Calcium: 9.8 mg/dL (ref 8.6–10.2)
Chloride: 103 mmol/L (ref 98–110)
Creat: 0.74 mg/dL (ref 0.50–1.10)
Globulin: 2.5 g/dL (calc) (ref 1.9–3.7)
Glucose, Bld: 92 mg/dL (ref 65–99)
Potassium: 4.2 mmol/L (ref 3.5–5.3)
Sodium: 138 mmol/L (ref 135–146)
Total Bilirubin: 0.4 mg/dL (ref 0.2–1.2)
Total Protein: 7 g/dL (ref 6.1–8.1)

## 2020-06-10 LAB — T4, FREE: Free T4: 1.1 ng/dL (ref 0.8–1.8)

## 2020-06-10 LAB — LIPID PANEL
Cholesterol: 142 mg/dL (ref <200)
HDL: 65 mg/dL (ref >=50)
LDL Cholesterol (Calc): 63 mg/dL (calc)
Non-HDL Cholesterol (Calc): 77 mg/dL (calc) (ref <130)
Total CHOL/HDL Ratio: 2.2 (calc) (ref <5.0)
Triglycerides: 55 mg/dL (ref <150)

## 2020-06-10 LAB — HEMOGLOBIN A1C
Hgb A1c MFr Bld: 5 % of total Hgb (ref <5.7)
Mean Plasma Glucose: 97 (calc)
eAG (mmol/L): 5.4 (calc)

## 2020-06-10 LAB — TSH: TSH: 2.32 mIU/L

## 2020-06-11 MED ORDER — CEPHALEXIN 500 MG PO CAPS
500.0000 mg | ORAL_CAPSULE | Freq: Two times a day (BID) | ORAL | 0 refills | Status: DC
Start: 2020-06-11 — End: 2021-04-19

## 2020-06-13 ENCOUNTER — Encounter (INDEPENDENT_AMBULATORY_CARE_PROVIDER_SITE_OTHER): Payer: Managed Care, Other (non HMO)

## 2020-06-13 DIAGNOSIS — R002 Palpitations: Secondary | ICD-10-CM

## 2020-06-15 ENCOUNTER — Telehealth: Payer: Self-pay | Admitting: Family Medicine

## 2020-06-15 ENCOUNTER — Encounter: Payer: Self-pay | Admitting: Family Medicine

## 2020-06-15 NOTE — Telephone Encounter (Signed)
Returned call to patient and went over her options. She will have cell service most of the time and will also have Wifi she can switch the monitor to.

## 2020-06-15 NOTE — Telephone Encounter (Signed)
Follow Up  Patient is going out of town from 06/21/20 - 06/24/20 to [place] and states that she will not have much cell phone reception for the monitor. Patient wanted to make the office aware and to see what she needed to do. Please give patient a call to discuss.

## 2020-06-15 NOTE — Telephone Encounter (Signed)
Pt called to say her mother is on medication for palpitations.   Pt has a heart monitor hooked up to her and is wondering if she needs to tell Dr. Ethlyn Gallery that she will not have a phone signal and the device will not be working while she is away for 4 days Thursday through Sunday.  Please advise

## 2020-06-15 NOTE — Telephone Encounter (Signed)
That should be ok. I would advise communicating the lack of phone/recording to the company in charge of the monitoring just to give them a heads up. If she knows any more details about mom's palpitations, please document in fam hx (ie intermittent palpitations versus atrial fib, etc)

## 2020-06-15 NOTE — Telephone Encounter (Signed)
Spoke with the pt and informed her of the message below.  Added information below to the pts family history.

## 2020-07-19 ENCOUNTER — Ambulatory Visit: Payer: Self-pay | Attending: Internal Medicine

## 2020-07-19 DIAGNOSIS — Z23 Encounter for immunization: Secondary | ICD-10-CM

## 2020-07-19 NOTE — Progress Notes (Signed)
   Covid-19 Vaccination Clinic  Name:  Kayla Mccall    MRN: [id number] DOB: Mar 08, 1976  07/19/2020  [name] was observed post Covid-19 immunization for 30 minutes based on pre-vaccination screening without incident. She was provided with Vaccine Information Sheet and instruction to access the V-Safe system.   [name] was instructed to call 911 with any severe reactions post vaccine: Marland Kitchen Difficulty breathing  . Swelling of face and throat  . A fast heartbeat  . A bad rash all over body  . Dizziness and weakness   Immunizations Administered    Name Date Dose VIS Date [place] COVID-19 Vaccine 07/19/2020  9:52 AM 0.3 mL 01/04/2019 Intramuscular   Manufacturer: [employer]   Lot: [id number]   [place]: [id number]

## 2020-08-09 ENCOUNTER — Ambulatory Visit: Payer: Managed Care, Other (non HMO) | Attending: Internal Medicine

## 2020-08-09 DIAGNOSIS — Z23 Encounter for immunization: Secondary | ICD-10-CM

## 2020-08-09 NOTE — Progress Notes (Signed)
   Covid-19 Vaccination Clinic  Name:  Kayla Mccall    MRN: [id number] DOB: 19-Aug-1976  08/09/2020  Kayla Mccall was observed post Covid-19 immunization for 15 minutes without incident. She was provided with Vaccine Information Sheet and instruction to access the V-Safe system.   Kayla Mccall was instructed to call 911 with any severe reactions post vaccine: Marland Kitchen Difficulty breathing  . Swelling of face and throat  . A fast heartbeat  . A bad rash all over body  . Dizziness and weakness   Immunizations Administered    Name Date Dose VIS Date Route   Pfizer COVID-19 Vaccine 08/09/2020  9:00 AM 0.3 mL 01/04/2019 Intramuscular   Manufacturer: [place]   Lot: [id number]   [place]: [id number]

## 2021-02-07 ENCOUNTER — Other Ambulatory Visit: Payer: Self-pay | Admitting: Family Medicine

## 2021-02-07 DIAGNOSIS — Z1231 Encounter for screening mammogram for malignant neoplasm of breast: Secondary | ICD-10-CM

## 2021-04-18 ENCOUNTER — Other Ambulatory Visit: Payer: Self-pay

## 2021-04-19 ENCOUNTER — Ambulatory Visit (INDEPENDENT_AMBULATORY_CARE_PROVIDER_SITE_OTHER)
Admission: RE | Admit: 2021-04-19 | Discharge: 2021-04-19 | Disposition: A | Discharge status: Home / Self Care | Payer: Managed Care, Other (non HMO) | Source: Ambulatory Visit | Attending: Family Medicine | Admitting: Family Medicine

## 2021-04-19 ENCOUNTER — Ambulatory Visit: Payer: Managed Care, Other (non HMO) | Admitting: Family Medicine

## 2021-04-19 ENCOUNTER — Encounter: Payer: Self-pay | Admitting: Family Medicine

## 2021-04-19 VITALS — BP 136/78 | HR 87 | Temp 98.1°F | Ht 63.75 in | Wt 187.6 lb

## 2021-04-19 DIAGNOSIS — K219 Gastro-esophageal reflux disease without esophagitis: Secondary | ICD-10-CM

## 2021-04-19 DIAGNOSIS — R0602 Shortness of breath: Secondary | ICD-10-CM | POA: Diagnosis not present

## 2021-04-19 MED ORDER — PANTOPRAZOLE SODIUM 20 MG PO TBEC
20.0000 mg | DELAYED_RELEASE_TABLET | Freq: Every day | ORAL | 1 refills | Status: DC
Start: 2021-04-19 — End: 2021-10-02

## 2021-04-19 MED ORDER — AEROCHAMBER PLUS MISC: 0 refills | Status: DC

## 2021-04-19 MED ORDER — ALBUTEROL SULFATE HFA 108 (90 BASE) MCG/ACT IN AERS
2.0000 | INHALATION_SPRAY | Freq: Four times a day (QID) | RESPIRATORY_TRACT | 0 refills | Status: DC | PRN
Start: 2021-04-19 — End: 2021-10-02

## 2021-04-19 NOTE — Patient Instructions (Signed)
*  take the protonix daily 30 minutes prior to dinner. Give yourself 2 hours after eating before laying down.   *through the weekend, try the albuterol inhaler 2 puffs twice daily. Use the albuterol inhaler with the spacing chamber.

## 2021-04-19 NOTE — Progress Notes (Signed)
Kayla Mccall DOB: 1976/03/27 Encounter date: 04/19/2021  This is a 45 y.o. female who presents with Chief Complaint  Patient presents with   Follow-up    History of present illness:  Sometimes has cough and gets something up. In urgent care in past, told her she may have GERD. Has just felt like it was getting worse since she was in here last. Even taking out trash; even small walks feels like she is getting out of breath. Some days even family can hear her wheezing. Sometimes at night - even getting up to go to the bathroom feels short of breath.   Coughs easily. Coughing every day. Not always productive, but does feel like she is always trying to clear throat. Sometimes chest feels like it is tightening up. Right side of neck feels like it is tightening up; not always, but happens sometimes. More clear that is coughed up now. No chest pain/needle sensation that she was getting before- this has resolved. No personal smoking hx.   Has been using otc gerd medication off an on since last visit.   Has been a couple of months since she noted some elevated HR.   Parents were smokers her whole life; she had bronchitis as kid; not dx with asthma.   Has been doing intermittent fasting and had success with losing weight with this.   No Known Allergies Current Meds  Medication Sig   albuterol (VENTOLIN HFA) 108 (90 Base) MCG/ACT inhaler Inhale 2 puffs into the lungs every 6 (six) hours as needed for wheezing or shortness of breath.   loratadine (CLARITIN) 10 MG tablet Take 10 mg by mouth daily.   Multiple Vitamins-Minerals (MULTIVITAMIN ADULTS PO) Take by mouth.   Naproxen Sodium (ALEVE PO) Take by mouth as needed (menstrual cramps).   OVER THE COUNTER MEDICATION OTC medication for acid-name unknown   pantoprazole (PROTONIX) 20 MG tablet Take 1 tablet (20 mg total) by mouth daily.   Spacer/Aero-Holding Chambers (AEROCHAMBER PLUS) inhaler Use as instructed   [DISCONTINUED] Phenazopyridine  HCl (AZO TABS PO) Take by mouth.    Review of Systems  Objective:  BP 136/78 (BP Location: Left Arm, Patient Position: Sitting, Cuff Size: Normal)   Pulse 87   Temp 98.1 F (36.7 C) (Oral)   Ht 5' 3.75" (1.619 m)   Wt 187 lb 9.6 oz (85.1 kg)   LMP 04/06/2021 (Exact Date)   SpO2 99%   BMI 32.45 kg/m   Weight: 187 lb 9.6 oz (85.1 kg)   BP Readings from Last 3 Encounters:  04/19/21 136/78  06/08/20 (!) 120/62  06/03/18 100/72   Wt Readings from Last 3 Encounters:  04/19/21 187 lb 9.6 oz (85.1 kg)  06/08/20 199 lb 8 oz (90.5 kg)  06/03/18 222 lb 4.8 oz (100.8 kg)    Physical Exam Constitutional:      General: She is not in acute distress.    Appearance: She is well-developed.  HENT:     Right Ear: Tympanic membrane, ear canal and external ear normal.     Left Ear: Tympanic membrane, ear canal and external ear normal.     Mouth/Throat:     Mouth: Mucous membranes are moist.     Pharynx: Oropharynx is clear. No posterior oropharyngeal erythema (some cobblestoning).  Cardiovascular:     Rate and Rhythm: Normal rate and regular rhythm.     Heart sounds: Normal heart sounds. No murmur heard.   No friction rub.  Pulmonary:     Effort:  Pulmonary effort is normal. No respiratory distress.     Breath sounds: Normal breath sounds. Stridor: loud inspiratory breathe, not stridor. No wheezing, rhonchi or rales.  Musculoskeletal:     Right lower leg: No edema.     Left lower leg: No edema.  Neurological:     Mental Status: She is alert and oriented to person, place, and time.  Psychiatric:        Behavior: Behavior normal.    Assessment/Plan  1. Shortness of breath Start with testing; will give albuterol bid trial through weekend; update me Monday. Further treatment/eval pending this.  - DG Chest 2 View; Future - Pulmonary function test; Future  2. Gastroesophageal reflux disease, unspecified whether esophagitis present Protonix in evening prior to dinner. Avoid food 2  hours prior to bed.    Return for pending testing results.      Micheline Rough, MD

## 2021-04-25 ENCOUNTER — Ambulatory Visit: Payer: Managed Care, Other (non HMO)

## 2021-05-01 ENCOUNTER — Encounter: Payer: Self-pay | Admitting: Family Medicine

## 2021-05-15 ENCOUNTER — Other Ambulatory Visit: Payer: Self-pay

## 2021-05-15 ENCOUNTER — Ambulatory Visit (INDEPENDENT_AMBULATORY_CARE_PROVIDER_SITE_OTHER): Payer: Managed Care, Other (non HMO) | Admitting: Internal Medicine

## 2021-05-15 DIAGNOSIS — R0602 Shortness of breath: Secondary | ICD-10-CM

## 2021-05-15 LAB — PULMONARY FUNCTION TEST
DL/VA % pred: 123 %
DL/VA: 5.41 ml/min/mmHg/L
DLCO cor % pred: 110 %
DLCO cor: 23.83 ml/min/mmHg
DLCO unc % pred: 110 %
DLCO unc: 23.83 ml/min/mmHg
FEF 25-75 Post: 2.38 L/sec
FEF 25-75 Pre: 2.32 L/sec
FEF2575-%Change-Post: 2 %
FEF2575-%Pred-Post: 79 %
FEF2575-%Pred-Pre: 77 %
FEV1-%Change-Post: 1 %
FEV1-%Pred-Post: 78 %
FEV1-%Pred-Pre: 77 %
FEV1-Post: 2.3 L
FEV1-Pre: 2.27 L
FEV1FVC-%Change-Post: 2 %
FEV1FVC-%Pred-Pre: 98 %
FEV6-%Change-Post: 0 %
FEV6-%Pred-Post: 79 %
FEV6-%Pred-Pre: 79 %
FEV6-Post: 2.82 L
FEV6-Pre: 2.84 L
FEV6FVC-%Pred-Post: 102 %
FEV6FVC-%Pred-Pre: 102 %
FVC-%Change-Post: 0 %
FVC-%Pred-Post: 77 %
FVC-%Pred-Pre: 78 %
FVC-Post: 2.82 L
FVC-Pre: 2.84 L
Post FEV1/FVC ratio: 82 %
Post FEV6/FVC ratio: 100 %
Pre FEV1/FVC ratio: 80 %
Pre FEV6/FVC Ratio: 100 %
RV % pred: 115 %
RV: 1.93 L
TLC % pred: 100 %
TLC: 5.09 L

## 2021-05-15 NOTE — Progress Notes (Signed)
PFT done today. 

## 2021-05-16 ENCOUNTER — Other Ambulatory Visit: Payer: Self-pay | Admitting: Family Medicine

## 2021-05-16 MED ORDER — BUDESONIDE-FORMOTEROL FUMARATE 160-4.5 MCG/ACT IN AERO: 1.0000 | INHALATION_SPRAY | Freq: Two times a day (BID) | RESPIRATORY_TRACT | 5 refills | Status: DC

## 2021-05-17 ENCOUNTER — Other Ambulatory Visit: Payer: Self-pay

## 2021-05-20 ENCOUNTER — Other Ambulatory Visit: Payer: Self-pay | Admitting: Family Medicine

## 2021-05-20 MED ORDER — FLUTICASONE-SALMETEROL 250-50 MCG/ACT IN AEPB: 1.0000 | INHALATION_SPRAY | Freq: Two times a day (BID) | RESPIRATORY_TRACT | 5 refills | Status: DC

## 2021-05-23 ENCOUNTER — Other Ambulatory Visit: Payer: Self-pay

## 2021-05-23 ENCOUNTER — Ambulatory Visit
Admission: RE | Admit: 2021-05-23 | Discharge: 2021-05-23 | Disposition: A | Discharge status: Home / Self Care | Payer: Managed Care, Other (non HMO) | Source: Ambulatory Visit | Attending: Family Medicine | Admitting: Family Medicine

## 2021-05-23 DIAGNOSIS — Z1231 Encounter for screening mammogram for malignant neoplasm of breast: Secondary | ICD-10-CM

## 2021-05-24 ENCOUNTER — Other Ambulatory Visit: Payer: Self-pay | Admitting: Family Medicine

## 2021-05-24 ENCOUNTER — Ambulatory Visit: Payer: Managed Care, Other (non HMO) | Admitting: Family Medicine

## 2021-05-24 DIAGNOSIS — N6001 Solitary cyst of right breast: Secondary | ICD-10-CM

## 2021-06-01 ENCOUNTER — Ambulatory Visit
Admission: RE | Admit: 2021-06-01 | Discharge: 2021-06-01 | Disposition: A | Discharge status: Home / Self Care | Payer: Managed Care, Other (non HMO) | Source: Ambulatory Visit | Attending: Family Medicine | Admitting: Family Medicine

## 2021-06-01 DIAGNOSIS — N6001 Solitary cyst of right breast: Secondary | ICD-10-CM

## 2021-06-20 ENCOUNTER — Other Ambulatory Visit: Payer: Self-pay

## 2021-06-21 ENCOUNTER — Encounter: Payer: Self-pay | Admitting: Family Medicine

## 2021-06-21 ENCOUNTER — Ambulatory Visit: Payer: Managed Care, Other (non HMO) | Admitting: Family Medicine

## 2021-06-21 VITALS — BP 110/72 | HR 70 | Temp 98.2°F | Ht 63.75 in | Wt 184.5 lb

## 2021-06-21 DIAGNOSIS — J4530 Mild persistent asthma, uncomplicated: Secondary | ICD-10-CM

## 2021-06-21 DIAGNOSIS — Z9109 Other allergy status, other than to drugs and biological substances: Secondary | ICD-10-CM | POA: Diagnosis not present

## 2021-06-21 MED ORDER — FLUTICASONE PROPIONATE 50 MCG/ACT NA SUSP: 2.0000 | Freq: Every day | NASAL | 6 refills | Status: DC

## 2021-06-21 MED ORDER — BUDESONIDE-FORMOTEROL FUMARATE 160-4.5 MCG/ACT IN AERO: 2.0000 | INHALATION_SPRAY | Freq: Two times a day (BID) | RESPIRATORY_TRACT | 3 refills | Status: DC

## 2021-06-21 NOTE — Progress Notes (Signed)
Kayla Mccall DOB: November 02, 1976 Encounter date: 06/21/2021  This is a 45 y.o. female who presents with Chief Complaint  Patient presents with   Follow-up     History of present illness:  Last visit was 04/19/2021.  We discussed Ongoing cough at that time.  We gave albuterol inhaler to try and started Protonix.  Chest x-ray completed on 6/10 was normal.  Pulmonary function testing was completed minimal airway obstruction.  Symbicort was sent in for her to use with spacing chamber.   *didn't note any benefit with the albuterol inhaler - still trouble breathing with even in house activity. Pills for reflux seemed like they helped some but was still coughing up phelgm - clear or yellow/green.   *symbicort inhaler has really worked for her. Still has some days that are harder for her. The very hot and humid weather seems to affect her. Using with spacing chamber.   *when getting stressed then feels like breathing is getting harder - harder to get good deep breathes in; sometimes feels some tightening in chest. Tries to calm self down when this happens and is able to get back to normal breathing.   *not coughing as much as she was before - she had been coughing daily before. Coughs in morning; getting some phelgm up in morning. Pretty much regular in morning. No runny nose, but some nasal congestion. Having to blow nose to feel like she can breathe through it. Has been going on for awhile. Takes claritin but just as needed. Has had allergies over the years. Had bronchitis when little (parents smoked).    No Known Allergies Current Meds  Medication Sig   albuterol (VENTOLIN HFA) 108 (90 Base) MCG/ACT inhaler Inhale 2 puffs into the lungs every 6 (six) hours as needed for wheezing or shortness of breath.   budesonide-formoterol (SYMBICORT) 160-4.5 MCG/ACT inhaler Inhale 2 puffs into the lungs in the morning and at bedtime.   fluticasone (FLONASE) 50 MCG/ACT nasal spray Place 2 sprays into both  nostrils daily.   loratadine (CLARITIN) 10 MG tablet Take 10 mg by mouth daily.   Multiple Vitamins-Minerals (MULTIVITAMIN ADULTS PO) Take by mouth.   Naproxen Sodium (ALEVE PO) Take by mouth as needed (menstrual cramps).   OVER THE COUNTER MEDICATION OTC medication for acid-name unknown   pantoprazole (PROTONIX) 20 MG tablet Take 1 tablet (20 mg total) by mouth daily.   Spacer/Aero-Holding Chambers (AEROCHAMBER PLUS) inhaler Use as instructed   [DISCONTINUED] fluticasone-salmeterol (ADVAIR) 250-50 MCG/ACT AEPB Inhale 1 puff into the lungs in the morning and at bedtime.    Review of Systems  Constitutional:  Negative for chills, fatigue and fever.  HENT:  Positive for congestion. Negative for sore throat. Nosebleeds: mild occasional bleed in morning.  Respiratory:  Negative for cough, chest tightness, shortness of breath and wheezing.   Cardiovascular:  Negative for chest pain, palpitations and leg swelling.   Objective:  BP 110/72 (BP Location: Left Arm, Patient Position: Sitting, Cuff Size: Large)   Pulse 70   Temp 98.2 F (36.8 C) (Oral)   Ht 5' 3.75" (1.619 m)   Wt 184 lb 8 oz (83.7 kg)   LMP 06/19/2021 (Exact Date)   SpO2 99%   BMI 31.92 kg/m   Weight: 184 lb 8 oz (83.7 kg)   BP Readings from Last 3 Encounters:  06/21/21 110/72  04/19/21 136/78  06/08/20 (!) 120/62   Wt Readings from Last 3 Encounters:  06/21/21 184 lb 8 oz (83.7 kg)  04/19/21  187 lb 9.6 oz (85.1 kg)  06/08/20 199 lb 8 oz (90.5 kg)    Physical Exam Constitutional:      General: She is not in acute distress.    Appearance: She is well-developed.  HENT:     Nose:     Comments: Erythema nasal mucosa    Mouth/Throat:     Mouth: Mucous membranes are moist.     Pharynx: Posterior oropharyngeal erythema (cobblestoning) present.  Cardiovascular:     Rate and Rhythm: Normal rate and regular rhythm.     Heart sounds: Normal heart sounds. No murmur heard.   No friction rub.  Pulmonary:     Effort:  Pulmonary effort is normal. No respiratory distress.     Breath sounds: Normal breath sounds. No wheezing or rales.  Musculoskeletal:     Right lower leg: No edema.     Left lower leg: No edema.  Neurological:     Mental Status: She is alert and oriented to person, place, and time.  Psychiatric:        Behavior: Behavior normal.    Assessment/Plan  1. Mild persistent asthma without complication She has improved significantly with the current symbicort inhaler; still with some increased work in breathing, more in afternoon. We are going to increase to 2 puffs in morning, one in evening and see how she does with this; ok to increase to 2 puffs bid. Adding flonase to help with nasal congestion/asthma triggered from pnd/allergy. Let me know if not continuing to improve.   2. Environmental allergies Flonase. Discussed new medication(s) today with patient. Discussed potential side effects and patient verbalized understanding. Discussed proper technique for nasal spray use along with spacing chamber for inhaler.    Return in about 3 months (around 09/21/2021) for Chronic condition visit.   (Continue protonix for now; consider d/c at next visit) 33 minutes spent in chart review, time with patient, charting, exam   Micheline Rough, MD

## 2021-09-20 ENCOUNTER — Ambulatory Visit: Payer: Managed Care, Other (non HMO) | Admitting: Family Medicine

## 2021-10-02 ENCOUNTER — Ambulatory Visit: Payer: Managed Care, Other (non HMO) | Admitting: Family Medicine

## 2021-10-02 ENCOUNTER — Encounter: Payer: Self-pay | Admitting: Family Medicine

## 2021-10-02 VITALS — BP 118/76 | HR 77 | Temp 97.9°F | Ht 63.75 in | Wt 185.5 lb

## 2021-10-02 DIAGNOSIS — R490 Dysphonia: Secondary | ICD-10-CM

## 2021-10-02 DIAGNOSIS — R0602 Shortness of breath: Secondary | ICD-10-CM

## 2021-10-02 DIAGNOSIS — J4530 Mild persistent asthma, uncomplicated: Secondary | ICD-10-CM | POA: Diagnosis not present

## 2021-10-02 DIAGNOSIS — Z9109 Other allergy status, other than to drugs and biological substances: Secondary | ICD-10-CM

## 2021-10-02 DIAGNOSIS — Z23 Encounter for immunization: Secondary | ICD-10-CM

## 2021-10-02 NOTE — Progress Notes (Signed)
Darrol Angel DOB: 21-Feb-1976 Encounter date: 10/02/2021  This is a 45 y.o. female who presents with Chief Complaint  Patient presents with   Follow-up    History of present illness: Last visit with me was 06/21/2021.  At that time she had an improvement in breathing with Symbicort inhaler.  We increased to better cover symptoms.  We added Flonase to help with nasal congestion/asthma trigger.  Continue Protonix, but was noted to consider stopping follow-up visit pending update.  Still having breathing issue. Comes and goes. If she sits on computer or watching tv she is ok, but with activity, she starts breathing heavy. Not worse, but just knows that breathing is just still not normal.   Also still has some congestion; this feels better. Notes when she wakes up; has to cough up something. Sometimes has color, sometimes not. Sometimes blood tinged. Does get blood with blowing nose. Started humidifier but this isn't helping. Nasal spray seemed to help with breathing a little at night.   Pain in right side of neck that comes and goes. Feels like it is a vein in right side of neck that goes up and down. Sometimes flares when not stressed, but notes more when she is more excited. Not sure if related to breathing.   She has been compliant with all medications.   Voice will get hoarse by end of day. Cough more limited to morning. All of the above symptoms have really been in the last couple of years. Have gotten worse with hoarse voice since last visit.    No Known Allergies Current Meds  Medication Sig   budesonide-formoterol (SYMBICORT) 160-4.5 MCG/ACT inhaler Inhale 2 puffs into the lungs in the morning and at bedtime.   loratadine (CLARITIN) 10 MG tablet Take 10 mg by mouth daily.   Multiple Vitamins-Minerals (MULTIVITAMIN ADULTS PO) Take by mouth.   Naproxen Sodium (ALEVE PO) Take by mouth as needed (menstrual cramps).   OVER THE COUNTER MEDICATION OTC medication for acid-name unknown    Spacer/Aero-Holding Chambers (AEROCHAMBER PLUS) inhaler Use as instructed   [DISCONTINUED] fluticasone (FLONASE) 50 MCG/ACT nasal spray Place 2 sprays into both nostrils daily.   [DISCONTINUED] pantoprazole (PROTONIX) 20 MG tablet Take 1 tablet (20 mg total) by mouth daily.    Review of Systems  Constitutional:  Negative for chills, fatigue and fever.  HENT:  Positive for congestion and postnasal drip. Negative for ear discharge, ear pain, sinus pressure and sinus pain.   Respiratory:  Positive for cough (in morning), shortness of breath and wheezing. Negative for chest tightness.   Cardiovascular:  Negative for chest pain, palpitations and leg swelling.   Objective:  BP 118/76 (BP Location: Left Arm, Patient Position: Sitting, Cuff Size: Large)   Pulse 77   Temp 97.9 F (36.6 C) (Oral)   Ht 5' 3.75" (1.619 m)   Wt 185 lb 8 oz (84.1 kg)   SpO2 99%   BMI 32.09 kg/m   Weight: 185 lb 8 oz (84.1 kg)   BP Readings from Last 3 Encounters:  10/02/21 118/76  06/21/21 110/72  04/19/21 136/78   Wt Readings from Last 3 Encounters:  10/02/21 185 lb 8 oz (84.1 kg)  06/21/21 184 lb 8 oz (83.7 kg)  04/19/21 187 lb 9.6 oz (85.1 kg)    Physical Exam Constitutional:      General: She is not in acute distress.    Appearance: She is well-developed.  Cardiovascular:     Rate and Rhythm: Normal rate and  regular rhythm.     Heart sounds: Normal heart sounds. No murmur heard.   No friction rub.  Pulmonary:     Effort: Pulmonary effort is normal. No respiratory distress.     Breath sounds: Normal breath sounds. No wheezing or rales.     Comments: She does not appear to work hard to breathe when sitting in room, but she does seem to work harder than expected to get breathe in, esp with more talking. No stridor.  Musculoskeletal:     Right lower leg: No edema.     Left lower leg: No edema.  Neurological:     Mental Status: She is alert and oriented to person, place, and time.  Psychiatric:         Behavior: Behavior normal.    Assessment/Plan  1. Mild persistent asthma without complication She definitely had improvement with breathing after starting symbicort, but sx have not completely improved.   2. Environmental allergies Nose has become extremely dry with use of flonase. Advised ok to continue with anti-histamine, but would avoid steroid nasal spray. Use vasoline to moisturize nose as well as nasal saline gel.  - Ambulatory referral to ENT  3. Hoarse voice quality I suspect that there is upper airway component to her difficulty with getting breath in. We have tried treating post nasal drainage and reflux for multiple months without significant improvement in her overall symptoms. Would like ENT to further evaluate.  - Ambulatory referral to ENT  4. Shortness of breath - Ambulatory referral to ENT  5. Need for immunization against influenza - Flu Vaccine QUAD 6+ mos PF IM (Fluarix Quad PF)  Return if symptoms worsen or fail to improve.     Micheline Rough, MD

## 2021-10-02 NOTE — Patient Instructions (Signed)
*  there was some improvement with breathing with symbicort. However, still with exertion breathing is harder. Now increased work with breathing with activity. Harder getting air in. Hoarse voice.   Very dry nose; some improvement with flonase, but cannot continue due to nasal dryness. Try vasoline in nose for moisture and nasal saline gel.   Also completed 3 months of anti-reflux med without benefit.   Concerned about upper airway issue.

## 2021-10-11 ENCOUNTER — Encounter: Payer: Self-pay | Admitting: Family Medicine

## 2021-10-11 DIAGNOSIS — R0602 Shortness of breath: Secondary | ICD-10-CM

## 2021-10-11 DIAGNOSIS — R490 Dysphonia: Secondary | ICD-10-CM

## 2021-11-07 NOTE — Addendum Note (Signed)
Addended by: Caren Macadam on: 11/07/2021 05:59 PM   Modules accepted: Orders

## 2021-12-02 NOTE — Addendum Note (Signed)
Addended by: Caren Macadam on: 12/02/2021 07:45 AM   Modules accepted: Orders

## 2021-12-09 NOTE — Addendum Note (Signed)
Addended by: Caren Macadam on: 12/09/2021 07:47 AM   Modules accepted: Orders

## 2022-01-01 ENCOUNTER — Ambulatory Visit (INDEPENDENT_AMBULATORY_CARE_PROVIDER_SITE_OTHER): Payer: Managed Care, Other (non HMO) | Admitting: Pulmonary Disease

## 2022-01-01 ENCOUNTER — Other Ambulatory Visit: Payer: Self-pay

## 2022-01-01 ENCOUNTER — Ambulatory Visit (INDEPENDENT_AMBULATORY_CARE_PROVIDER_SITE_OTHER)
Admission: RE | Admit: 2022-01-01 | Discharge: 2022-01-01 | Disposition: A | Discharge status: Home / Self Care | Payer: Managed Care, Other (non HMO) | Source: Ambulatory Visit | Attending: Pulmonary Disease | Admitting: Pulmonary Disease

## 2022-01-01 ENCOUNTER — Telehealth: Payer: Self-pay | Admitting: Pulmonary Disease

## 2022-01-01 ENCOUNTER — Encounter: Payer: Self-pay | Admitting: Pulmonary Disease

## 2022-01-01 VITALS — BP 118/70 | HR 72 | Temp 98.0°F | Ht 62.0 in | Wt 181.0 lb

## 2022-01-01 DIAGNOSIS — J4540 Moderate persistent asthma, uncomplicated: Secondary | ICD-10-CM

## 2022-01-01 DIAGNOSIS — R0609 Other forms of dyspnea: Secondary | ICD-10-CM

## 2022-01-01 DIAGNOSIS — R061 Stridor: Secondary | ICD-10-CM

## 2022-01-01 MED ORDER — PREDNISONE 20 MG PO TABS: 40.0000 mg | ORAL_TABLET | Freq: Every day | ORAL | 0 refills | Status: AC

## 2022-01-01 MED ORDER — BREZTRI AEROSPHERE 160-9-4.8 MCG/ACT IN AERO: 2.0000 | INHALATION_SPRAY | Freq: Two times a day (BID) | RESPIRATORY_TRACT | 2 refills | Status: DC

## 2022-01-01 NOTE — Progress Notes (Signed)
@Patient  ID: Kayla Mccall, female    DOB: September 06, 1976, 46 y.o.   MRN: [id number]  Chief Complaint  Patient presents with   Consult    Consult for SOB. Pt states it has gotten bad in the last 7 months. Pt was on symbicort in the past and it doesn't seem to help now     Referring provider: Caren Macadam, MD  HPI:   46 y.o. woman whom we are seeing in consultation for evaluation of dyspnea on exertion.  Most recent PCP note x2 reviewed.  She had several month history 7 to 8 months of dyspnea on exertion.  Started summer 2022.  Associated with cough is productive of sputum.  Dyspnea is worsening over time.  Worse on inclines and steps.  At present now on flat surfaces even over relatively short distances.  Gets very short of breath.  Heavy breathing.  Can rest and over time things calm down and improved.  Denies significant symptoms at rest in general.  However, breathing seems to worsen when she lies flat and this is worsening over the last couple of months.  Increased shortness of breath with this.  Very quickly after lying down.  Making it difficult for her to sleep.  No seasonal environmental factors she can identify that make things better or worse.  No other alleviating or exacerbating factors.  Was using Symbicort and it seemed to help for a bit but now no longer.  She is using Symbicort 1 puff twice a day despite being prescribed 2 puffs twice a day.  She is using a spacer.  Review chest x-ray 04/2021 that on my review interpretation reveals clear lungs bilaterally.  She had pulmonary function test 05/2021 as interpreted below the shows evidence of borderline air trapping.  Suggestion of variable intrathoracic obstruction with flattening of expiratory loop.  If it really looks mildly flattened but hard to say is most exam she did have flattening of the inspiratory loop.  PMH: Reviewed, seasonal allergies Surgical history: Reviewed, she denies any Family history: Mother with  emphysema, allergies, asthma Social history: Never smoker, lives in [facility], works from home and occasionally goes into the office in [employer] / Pulmonary Flowsheets:   ACT:  No flowsheet data found.  MMRC: No flowsheet data found.  Epworth:  No flowsheet data found.  Tests:   FENO:  No results found for: NITRICOXIDE  PFT: PFT Results Latest Ref Rng & Units 05/15/2021  FVC-Pre L 2.84  FVC-Predicted Pre % 78  FVC-Post L 2.82  FVC-Predicted Post % 77  Pre FEV1/FVC % % 80  Post FEV1/FCV % % 82  FEV1-Pre L 2.27  FEV1-Predicted Pre % 77  FEV1-Post L 2.30  DLCO uncorrected ml/min/mmHg 23.83  DLCO UNC% % 110  DLCO corrected ml/min/mmHg 23.83  DLCO COR %Predicted % 110  DLVA Predicted % 123  TLC L 5.09  TLC % Predicted % 100  RV % Predicted % 115  Personally reviewed and interpreted as spirometry suggestive of mild restriction versus gas trapping, no bronchodilator response, no fixed obstruction.  TLC within normal limits, borderline but not quite meeting diagnostic criteria for gas trapping on lung volumes.  DLCO within normal limits.  Flow-volume curve with flattening of expiratory loop, mild flattening of the inspiratory loop possibly suggestive of variable intrathoracic obstruction versus fixed obstruction  WALK:  No flowsheet data found.  Imaging: Personally reviewed and as per EMR discussion this note  Lab Results: Personally reviewed  no significant eosinophils, no IgE checked in the past CBC    Component Value Date/Time   WBC 6.9 06/08/2020 1038   RBC 4.45 06/08/2020 1038   HGB 13.6 06/08/2020 1038   HCT 40.0 06/08/2020 1038   PLT 322 06/08/2020 1038   MCV 89.9 06/08/2020 1038   MCH 30.6 06/08/2020 1038   MCHC 34.0 06/08/2020 1038   RDW 12.3 06/08/2020 1038   LYMPHSABS 1,318 06/08/2020 1038   EOSABS 48 06/08/2020 1038   BASOSABS 21 06/08/2020 1038    BMET    Component Value Date/Time   NA 138 06/08/2020 1038   K 4.2  06/08/2020 1038   CL 103 06/08/2020 1038   CO2 27 06/08/2020 1038   GLUCOSE 92 06/08/2020 1038   BUN 12 06/08/2020 1038   CREATININE 0.74 06/08/2020 1038   CALCIUM 9.8 06/08/2020 1038    BNP No results found for: BNP  ProBNP No results found for: PROBNP  Specialty Problems   None   No Known Allergies  Immunization History  Administered Date(s) Administered   Influenza Inj Mdck Quad With Preservative 08/10/2018   Influenza,inj,Quad PF,6+ Mos 12/19/2016, 10/02/2021   PFIZER(Purple Top)SARS-COV-2 Vaccination 07/19/2020, 08/09/2020   Tdap 01/04/2013    Past Medical History:  Diagnosis Date   Cancer ([place])    melanoma R LE   Chicken pox    Heart murmur    Migraines    Varicose veins     Tobacco History: Social History   Tobacco Use  Smoking Status Never  Smokeless Tobacco Never   Counseling given: Not Answered   Continue to not smoke  Outpatient Encounter Medications as of 01/01/2022  Medication Sig   Budeson-Glycopyrrol-Formoterol (BREZTRI AEROSPHERE) 160-9-4.8 MCG/ACT AERO Inhale 2 puffs into the lungs in the morning and at bedtime.   budesonide-formoterol (SYMBICORT) 160-4.5 MCG/ACT inhaler Inhale 2 puffs into the lungs in the morning and at bedtime.   loratadine (CLARITIN) 10 MG tablet Take 10 mg by mouth daily.   Multiple Vitamins-Minerals (MULTIVITAMIN ADULTS PO) Take by mouth.   Naproxen Sodium (ALEVE PO) Take by mouth as needed (menstrual cramps).   OVER THE COUNTER MEDICATION OTC medication for acid-name unknown   predniSONE (DELTASONE) 20 MG tablet Take 2 tablets (40 mg total) by mouth daily with breakfast for 5 days.   Spacer/Aero-Holding Chambers (AEROCHAMBER PLUS) inhaler Use as instructed   No facility-administered encounter medications on file as of 01/01/2022.     Review of Systems  Review of Systems  No chest pain with exertion.  Occasional posterior right neck pain described as shooting or pulsating that comes and goes. Physical  Exam  BP 118/70 (BP Location: Left Arm, Patient Position: Sitting, Cuff Size: Normal)    Pulse 72    Temp 98 F (36.7 C) (Oral)    Ht 5\' 2"  (1.575 m)    Wt 181 lb (82.1 kg)    SpO2 99%    BMI 33.11 kg/m   Wt Readings from Last 5 Encounters:  01/01/22 181 lb (82.1 kg)  10/02/21 185 lb 8 oz (84.1 kg)  06/21/21 184 lb 8 oz (83.7 kg)  04/19/21 187 lb 9.6 oz (85.1 kg)  06/08/20 199 lb 8 oz (90.5 kg)    BMI Readings from Last 5 Encounters:  01/01/22 33.11 kg/m  10/02/21 32.09 kg/m  06/21/21 31.92 kg/m  04/19/21 32.45 kg/m  06/08/20 34.51 kg/m     Physical Exam  General: Well-appearing, no acute distress Eyes: EOMI, icterus Neck: Supple, no JVP, thyroid feels  hard and but not enlarged Pulmonary: Noisy sounds more on inspiration though not quite stridulous when examined Nearly supine listening over trachea,, end expiratory wheeze in bilateral upper chest Cardiovascular: Regular in rhythm, warm Abdomen: Ostomy, sinus rhythm MSK: No synovitis, no joint effusion Neuro: Normal gait, no weakness Psych: Normal mood, full affect  Assessment & Plan:   Dyspnea on exertion: Suspect multifactorial.  Suspect a degree of deconditioning.  Her pulmonary function test are suggestive although does not fully meet criteria of air trapping which can be seen in Smir's disease or asthma.  In addition, there is flattening of the expiratory loop in the inspiratory loop looks a bit flattened although they often do which may be suggestive of either an intrathoracic variable obstruction versus a fixed obstruction.  She also has noisy inspiratory sounds but not quite stridulous on exam. --CT neck, CT chest to evaluate for intrathoracic obstruction versus fixed intrathoracic or extrathoracic obstruction --Agree with ongoing work-up via PCP including upcoming cardiac stress test  Asthma: Based on cough, borderline air trapping on PFTs.  Mild improvement with Symbicort although not so much anymore.  Only  using Symbicort 1 puff twice a day.  Will escalate to Breztri 2 puffs twice a day with spacer.  In addition, given the wheeze on exam, will prescribe prednisone 40 mg daily x5 days.   Return in about 3 months (around 03/31/2022).   Lanier Clam, MD 01/01/2022

## 2022-01-01 NOTE — Telephone Encounter (Signed)
Both of Pt's CTs have been auth.  Notified pt.  Nothing further needed at this time.

## 2022-01-01 NOTE — Progress Notes (Signed)
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  0.8 x 1.2 cm on my calculation.  Called ENT office and asked to speak to the provider she had seen last month for evaluation of similar problem on the on-call line.  After brief hold, was told I would be contacted back.  I provided my personal cell number.  Awaiting discussion with ENT office in terms of coordinating ongoing care.

## 2022-01-01 NOTE — Patient Instructions (Signed)
Nice to meet you  Based on your breathing test, there are subtle signs of something like asthma.  To aggressively treat this, take prednisone 40 mg daily for the next 5 days.  In addition, I prescribed a new inhaler called Breztri.  2 puffs twice a day every day.  Use with your spacer.  Rinse out your mouth after every use.  If this is too expensive let us know.  The new inhaler has a third medication designed to help open up the airways and make it easier for air to get in and out and to improve your breathing, shortness of breath, wheezing.  Given some of the noisy breathing sounds, ordered a CT scan of your neck and your chest to evaluate if there is narrowing of the breathing tubes that could be causing some of your symptoms.  Return to clinic in 2 months or sooner if needed with Dr. Silas Flood

## 2022-01-03 ENCOUNTER — Telehealth: Payer: Self-pay | Admitting: Pulmonary Disease

## 2022-01-03 DIAGNOSIS — J386 Stenosis of larynx: Secondary | ICD-10-CM

## 2022-01-03 NOTE — Telephone Encounter (Signed)
Called and discussed findings of CT scan with patient.  Mild subglottic narrowing likely accounting for some of her symptoms as well as findings on flow-volume curve.  Discussed my recommendation for referral to ENT physician at [facility] in [place].  She expressed understanding and is okay with this.  Referral placed today.  Routing to [employer] as Juluis Rainier.

## 2022-01-06 ENCOUNTER — Encounter: Payer: Self-pay | Admitting: Pulmonary Disease

## 2022-01-08 ENCOUNTER — Other Ambulatory Visit (HOSPITAL_COMMUNITY): Payer: Managed Care, Other (non HMO)

## 2022-02-06 ENCOUNTER — Telehealth: Payer: Self-pay | Admitting: Family Medicine

## 2022-02-06 DIAGNOSIS — Z1211 Encounter for screening for malignant neoplasm of colon: Secondary | ICD-10-CM

## 2022-02-06 NOTE — Telephone Encounter (Signed)
Pt is calling and she is 46 years old and would like to know if she needs colonoscopy etc ?

## 2022-02-07 NOTE — Telephone Encounter (Signed)
[place] for referral for colonoscopy for colon cancer screening. I suggest [facility]. ?

## 2022-02-10 NOTE — Telephone Encounter (Signed)
Spoke with the patient, informed her the referral was placed and someone will contact her with appt info. ?

## 2022-02-18 ENCOUNTER — Encounter: Payer: Self-pay | Admitting: Internal Medicine

## 2022-02-24 HISTORY — PX: LARYNGOSCOPY WITH DILATATION: SHX5940

## 2022-02-27 ENCOUNTER — Encounter: Payer: Self-pay | Admitting: Family Medicine

## 2022-03-05 ENCOUNTER — Ambulatory Visit (INDEPENDENT_AMBULATORY_CARE_PROVIDER_SITE_OTHER): Payer: Managed Care, Other (non HMO) | Admitting: Pulmonary Disease

## 2022-03-05 ENCOUNTER — Encounter: Payer: Self-pay | Admitting: Pulmonary Disease

## 2022-03-05 ENCOUNTER — Telehealth: Payer: Self-pay | Admitting: *Deleted

## 2022-03-05 VITALS — BP 124/76 | HR 71 | Temp 98.0°F | Ht 62.0 in | Wt 179.8 lb

## 2022-03-05 DIAGNOSIS — R0609 Other forms of dyspnea: Secondary | ICD-10-CM

## 2022-03-05 DIAGNOSIS — R053 Chronic cough: Secondary | ICD-10-CM | POA: Diagnosis not present

## 2022-03-05 MED ORDER — AZITHROMYCIN 250 MG PO TABS: ORAL_TABLET | ORAL | 0 refills | Status: DC

## 2022-03-05 MED ORDER — PREDNISONE 20 MG PO TABS: 20.0000 mg | ORAL_TABLET | Freq: Every day | ORAL | 0 refills | Status: AC

## 2022-03-05 NOTE — Telephone Encounter (Signed)
Noted! Thank you

## 2022-03-05 NOTE — Telephone Encounter (Signed)
Robbin,  This pt is cleared for anesthetic care at [facility].   Thanks,   Shakur Lembo 

## 2022-03-05 NOTE — Progress Notes (Signed)
? ?'@Patient'$  ID: Kayla Mccall, female    DOB: [birth year], 46 y.o.   MRN: [id number] ? ?Chief Complaint  ?Patient presents with  ? Follow-up  ?  Follow up. Pt states last Monday she had surgery at [name]. She states that her breathing is doing a lot better. Bronchoscope and a balloon dilation on the trachea.   ? ? ?Referring provider: ?Caren Macadam, MD ? ?HPI:  ? ?46 y.o. woman whom we are seeing in follow up for evaluation of dyspnea on exertion with FV loop and CT suggestive of tracheal stenosis. ENT office note and operative report reviewed. ? ?Overall, marked improvement in dyspnea status post interventional tracheal stenosis for 02/24/22.  Breathing immediately better the next day.  Her chief complaint today is cough.  Present for many years.  Seems a bit worse over the last 2 or 3 months.  Cannot recall much discussion about this in the past.  It is productive.  Not with every cough and general productive of mucus.  Color changed was kind of reddish-brown but now back to baseline few days after tracheal intervention.  Overall relatively stable over the last couple years.  Reviewed recent CT scan of chest, lungs bilaterally, no cause for cough from a parenchymal perspective on my interpretation.  She had a lot of nasal congestion prior to tracheal intervention.  Is not better.  Denies any postnasal drip currently.  Was placed on ICS/LABA therapy at last visit without improvement.  However, she has not been using this over the last few weeks. ? ?HPI at initial visit: ?She had several month history 7 to 8 months of dyspnea on exertion.  Started summer 2022.  Associated with cough is productive of sputum.  Dyspnea is worsening over time.  Worse on inclines and steps.  At present now on flat surfaces even over relatively short distances.  Gets very short of breath.  Heavy breathing.  Can rest and over time things calm down and improved.  Denies significant symptoms at rest in general.  However, breathing  seems to worsen when she lies flat and this is worsening over the last couple of months.  Increased shortness of breath with this.  Very quickly after lying down.  Making it difficult for her to sleep.  No seasonal environmental factors she can identify that make things better or worse.  No other alleviating or exacerbating factors.  Was using Symbicort and it seemed to help for a bit but now no longer.  She is using Symbicort 1 puff twice a day despite being prescribed 2 puffs twice a day.  She is using a spacer. ? ?Review chest x-ray 04/2021 that on my review interpretation reveals clear lungs bilaterally.  She had pulmonary function test 05/2021 as interpreted below the shows evidence of borderline air trapping.  Suggestion of variable intrathoracic obstruction with flattening of expiratory loop.  If it really looks mildly flattened but hard to say is most exam she did have flattening of the inspiratory loop. ? ?PMH: Reviewed, seasonal allergies ?Surgical history: Reviewed, she denies any ?Family history: Mother with emphysema, allergies, asthma ?Social history: Never smoker, lives in [place], works from home and occasionally goes into the office in [employer] ? ?Questionaires / Pulmonary Flowsheets:  ? ?ACT:  ?   ? View : No data to display.  ?  ?  ?  ? ? ?MMRC: ?   ? View : No data to display.  ?  ?  ?  ? ? ?  Epworth:  ?   ? View : No data to display.  ?  ?  ?  ? ? ?Tests:  ? ?FENO:  ?No results found for: NITRICOXIDE ? ?PFT: ? ?  Latest Ref Rng & Units 05/15/2021  ?  9:56 AM  ?PFT Results  ?FVC-Pre L 2.84    ?FVC-Predicted Pre % 78    ?FVC-Post L 2.82    ?FVC-Predicted Post % 77    ?Pre FEV1/FVC % % 80    ?Post FEV1/FCV % % 82    ?FEV1-Pre L 2.27    ?FEV1-Predicted Pre % 77    ?FEV1-Post L 2.30    ?DLCO uncorrected ml/min/mmHg 23.83    ?DLCO UNC% % 110    ?DLCO corrected ml/min/mmHg 23.83    ?DLCO COR %Predicted % 110    ?DLVA Predicted % 123    ?TLC L 5.09    ?TLC % Predicted % 100    ?RV % Predicted  % 115    ?Personally reviewed and interpreted as spirometry suggestive of mild restriction versus gas trapping, no bronchodilator response, no fixed obstruction.  TLC within normal limits, borderline but not quite meeting diagnostic criteria for gas trapping on lung volumes.  DLCO within normal limits.  Flow-volume curve with flattening of expiratory loop, mild flattening of the inspiratory loop possibly suggestive of variable intrathoracic obstruction versus fixed obstruction ? ?WALK:  ?   ? View : No data to display.  ?  ?  ?  ? ? ?Imaging: ?Personally reviewed and as per EMR discussion this note ? ?Lab Results: ?Personally reviewed no significant eosinophils, no IgE checked in the past ?CBC ?   ?Component Value Date/Time  ? WBC 6.9 06/08/2020 1038  ? RBC 4.45 06/08/2020 1038  ? HGB 13.6 06/08/2020 1038  ? HCT 40.0 06/08/2020 1038  ? PLT 322 06/08/2020 1038  ? MCV 89.9 06/08/2020 1038  ? MCH 30.6 06/08/2020 1038  ? MCHC 34.0 06/08/2020 1038  ? RDW 12.3 06/08/2020 1038  ? LYMPHSABS 1,318 06/08/2020 1038  ? EOSABS 48 06/08/2020 1038  ? BASOSABS 21 06/08/2020 1038  ? ? ?BMET ?   ?Component Value Date/Time  ? NA 138 06/08/2020 1038  ? K 4.2 06/08/2020 1038  ? CL 103 06/08/2020 1038  ? CO2 27 06/08/2020 1038  ? GLUCOSE 92 06/08/2020 1038  ? BUN 12 06/08/2020 1038  ? CREATININE 0.74 06/08/2020 1038  ? CALCIUM 9.8 06/08/2020 1038  ? ? ?BNP ?No results found for: BNP ? ?ProBNP ?No results found for: PROBNP ? ?Specialty Problems   ?None ? ? ?No Known Allergies ? ?Immunization History  ?Administered Date(s) Administered  ? Influenza Inj Mdck Quad With Preservative 08/10/2018  ? Influenza,inj,Quad PF,6+ Mos 12/19/2016, 10/02/2021  ? PFIZER(Purple Top)SARS-COV-2 Vaccination 07/19/2020, 08/09/2020  ? Tdap 01/04/2013  ? ? ?Past Medical History:  ?Diagnosis Date  ? Cancer [facility])   ? melanoma R LE  ? Chicken pox   ? Heart murmur   ? Migraines   ? Varicose veins   ? ? ?Tobacco History: ?Social History  ? ?Tobacco Use  ?Smoking  Status Never  ?Smokeless Tobacco Never  ? ?Counseling given: Not Answered ? ? ?Continue to not smoke ? ?Outpatient Encounter Medications as of 03/05/2022  ?Medication Sig  ? azithromycin (ZITHROMAX) 250 MG tablet 500 mg (2 tab) day 1 then 250 mg (1 tab) days 2-5.  ? loratadine (CLARITIN) 10 MG tablet Take 10 mg by mouth daily.  ? Multiple Vitamins-Minerals (MULTIVITAMIN ADULTS PO)  Take by mouth.  ? Naproxen Sodium (ALEVE PO) Take by mouth as needed (menstrual cramps).  ? OVER THE COUNTER MEDICATION OTC medication for acid-name unknown  ? predniSONE (DELTASONE) 20 MG tablet Take 1 tablet (20 mg total) by mouth daily with breakfast for 5 days.  ? Spacer/Aero-Holding Chambers (AEROCHAMBER PLUS) inhaler Use as instructed  ? Budeson-Glycopyrrol-Formoterol (BREZTRI AEROSPHERE) 160-9-4.8 MCG/ACT AERO Inhale 2 puffs into the lungs in the morning and at bedtime. (Patient not taking: Reported on 03/05/2022)  ? budesonide-formoterol (SYMBICORT) 160-4.5 MCG/ACT inhaler Inhale 2 puffs into the lungs in the morning and at bedtime. (Patient not taking: Reported on 03/05/2022)  ? ?No facility-administered encounter medications on file as of 03/05/2022.  ? ? ? ?Review of Systems ? ?Review of Systems  ?No chest pain with exertion.  Occasional posterior right neck pain described as shooting or pulsating that comes and goes. ?Physical Exam ? ?BP 124/76 (BP Location: Left Arm, Patient Position: Sitting, Cuff Size: Normal)   Pulse 71   Temp 98 ?F (36.7 ?C) (Oral)   Ht '5\' 2"'$  (1.575 m)   Wt 179 lb 12.8 oz (81.6 kg)   SpO2 97%   BMI 32.89 kg/m?  ? ?Wt Readings from Last 5 Encounters:  ?03/05/22 179 lb 12.8 oz (81.6 kg)  ?01/01/22 181 lb (82.1 kg)  ?10/02/21 185 lb 8 oz (84.1 kg)  ?06/21/21 184 lb 8 oz (83.7 kg)  ?04/19/21 187 lb 9.6 oz (85.1 kg)  ? ? ?BMI Readings from Last 5 Encounters:  ?03/05/22 32.89 kg/m?  ?01/01/22 33.11 kg/m?  ?10/02/21 32.09 kg/m?  ?06/21/21 31.92 kg/m?  ?04/19/21 32.45 kg/m?  ? ? ? ?Physical Exam ? ?General:  Well-appearing, no acute distress ?Eyes: EOMI, icterus ?Neck: Supple, no JVP, thyroid feels hard and but not enlarged ?Pulmonary: Noisy sounds more on inspiration though not quite stridulous when examined Nearly sup

## 2022-03-05 NOTE — Telephone Encounter (Signed)
[name], ? ?Please review. Okay for direct screening colonoscopy at [facility]? Thank you, Shakiyah Cirilo pv  ?

## 2022-03-05 NOTE — Patient Instructions (Signed)
Neck to see you again ? ?Please resume the Breztri 2 puff twice a day every day.  Use the spacer if you still have it.  Please rinse your mouth out after every use. ? ?Take prednisone 20 mg daily for 5 days, take azithromycin 500 mg day 1 (2 tabs) then 250 mg day 2 through 5 (1 tab). ? ?I am hopeful these medications will help decrease the cough and phlegm production.  If not we will go back to the drawing board. ? ?Return to clinic in 6 weeks or sooner as needed with Dr. Silas Flood ?

## 2022-03-06 ENCOUNTER — Telehealth (HOSPITAL_COMMUNITY): Payer: Self-pay | Admitting: Family Medicine

## 2022-03-06 NOTE — Telephone Encounter (Signed)
Patient cancelled stress echocardiogram that was ordered.  The order will be removed from the WQ. Thank you ?

## 2022-03-07 NOTE — Telephone Encounter (Signed)
Noted  

## 2022-03-18 ENCOUNTER — Ambulatory Visit (HOSPITAL_COMMUNITY): Payer: Managed Care, Other (non HMO)

## 2022-03-20 ENCOUNTER — Ambulatory Visit (AMBULATORY_SURGERY_CENTER): Payer: Managed Care, Other (non HMO) | Admitting: *Deleted

## 2022-03-20 VITALS — Ht 62.0 in | Wt 179.0 lb

## 2022-03-20 DIAGNOSIS — Z1211 Encounter for screening for malignant neoplasm of colon: Secondary | ICD-10-CM

## 2022-03-20 MED ORDER — NA SULFATE-K SULFATE-MG SULF 17.5-3.13-1.6 GM/177ML PO SOLN: 1.0000 | ORAL | 0 refills | Status: DC

## 2022-03-20 NOTE — Progress Notes (Signed)
Patient's pre-visit was done today over the phone with the patient. Name,DOB and address verified. Patient denies any allergies to Eggs and Soy. Patient denies any problems with anesthesia/sedation. Patient is not taking any diet pills or blood thinners. No home Oxygen. Insurance confirmed with patient. ? ?Prep instructions sent to pt's MyChart & mailed to pt-pt is aware. Patient understands to call us back with any questions or concerns. Patient is aware of our care-partner policy.  ? ?EMMI education assigned to the patient for the procedure, sent to [place].  ? ?The patient is COVID-19 vaccinated.   ?

## 2022-04-08 ENCOUNTER — Encounter: Payer: Self-pay | Admitting: Internal Medicine

## 2022-04-10 ENCOUNTER — Ambulatory Visit (AMBULATORY_SURGERY_CENTER): Payer: Managed Care, Other (non HMO) | Admitting: Internal Medicine

## 2022-04-10 ENCOUNTER — Encounter: Payer: Self-pay | Admitting: Internal Medicine

## 2022-04-10 VITALS — BP 101/59 | HR 69 | Temp 98.4°F | Resp 15 | Ht 62.0 in | Wt 179.0 lb

## 2022-04-10 DIAGNOSIS — Z1211 Encounter for screening for malignant neoplasm of colon: Secondary | ICD-10-CM | POA: Diagnosis present

## 2022-04-10 MED ORDER — SODIUM CHLORIDE 0.9 % IV SOLN: 500.0000 mL | Freq: Once | INTRAVENOUS | Status: DC

## 2022-04-10 MED ORDER — HYDROCORTISONE (PERIANAL) 2.5 % EX CREA: 1.0000 "application " | TOPICAL_CREAM | Freq: Two times a day (BID) | CUTANEOUS | 1 refills | Status: DC

## 2022-04-10 MED ORDER — HYDROCORTISONE (PERIANAL) 2.5 % EX CREA: 1.0000 "application " | TOPICAL_CREAM | Freq: Two times a day (BID) | CUTANEOUS | 0 refills | Status: AC

## 2022-04-10 NOTE — Progress Notes (Signed)
GASTROENTEROLOGY PROCEDURE H&P NOTE   Primary Care Physician: Caren Macadam, MD    Reason for Procedure:   Colon cancer screening  Plan:    Colonoscopy  Patient is appropriate for endoscopic procedure(s) in the ambulatory ([place]) setting.  The nature of the procedure, as well as the risks, benefits, and alternatives were carefully and thoroughly reviewed with the patient. Ample time for discussion and questions allowed. The patient understood, was satisfied, and agreed to proceed.     HPI: Kayla Mccall is a 46 y.o. female who presents for colonoscopy for colon cancer screening. Denies blood in stools, changes in bowel habits, weight loss. Denies fam hx of colon cancer.  Past Medical History:  Diagnosis Date   Cancer ([place])    melanoma R LE   Chicken pox    Heart murmur    Migraines    Varicose veins     Past Surgical History:  Procedure Laterality Date   LARYNGOSCOPY WITH DILATATION  02/24/2022   WISDOM TOOTH EXTRACTION      Prior to Admission medications   Medication Sig Start Date End Date Taking? Authorizing Provider  Ascorbic Acid (VITAMIN C PO) Take by mouth.   Yes [provider]  Budeson-Glycopyrrol-Formoterol (BREZTRI AEROSPHERE) 160-9-4.8 MCG/ACT AERO Inhale 2 puffs into the lungs in the morning and at bedtime. 01/01/22  Yes Hunsucker, Bonna Gains, MD  budesonide-formoterol [facility]) 160-4.5 MCG/ACT inhaler Inhale 2 puffs into the lungs in the morning and at bedtime. 06/21/21  Yes Koberlein, Junell C, MD  loratadine (CLARITIN) 10 MG tablet Take 10 mg by mouth daily.   Yes [provider]  Multiple Vitamin (MULTI-VITAMIN) tablet Take 1 tablet by mouth daily.   Yes [provider]  Spacer/Aero-Holding Chambers (AEROCHAMBER PLUS) inhaler Use as instructed 04/19/21  Yes Koberlein, Junell C, MD  Naproxen Sodium (ALEVE PO) Take by mouth as needed (menstrual cramps).    [provider]    Current Outpatient Medications   Medication Sig Dispense Refill   Ascorbic Acid (VITAMIN C PO) Take by mouth.     Budeson-Glycopyrrol-Formoterol (BREZTRI AEROSPHERE) 160-9-4.8 MCG/ACT AERO Inhale 2 puffs into the lungs in the morning and at bedtime. 10.7 g 2   budesonide-formoterol (SYMBICORT) 160-4.5 MCG/ACT inhaler Inhale 2 puffs into the lungs in the morning and at bedtime. 3 each 3   loratadine (CLARITIN) 10 MG tablet Take 10 mg by mouth daily.     Multiple Vitamin (MULTI-VITAMIN) tablet Take 1 tablet by mouth daily.     Spacer/Aero-Holding Chambers (AEROCHAMBER PLUS) inhaler Use as instructed 1 each 0   Naproxen Sodium (ALEVE PO) Take by mouth as needed (menstrual cramps).     Current Facility-Administered Medications  Medication Dose Route Frequency Provider Last Rate Last Admin   0.9 %  sodium chloride infusion  500 mL Intravenous Once Sharyn Creamer, MD        Allergies as of 04/10/2022   (No Known Allergies)    Family History  Problem Relation Age of Onset   Hyperlipidemia Mother    Diabetes Mother    Other Mother        palpitations   Other Father        suicide   Heart murmur Brother    Arthritis Maternal Grandmother    Diabetes Maternal Grandmother    Healthy Half-Brother    Healthy Half-Sister    Breast cancer Other        maternal great grandmother   Colon cancer Neg Hx  Esophageal cancer Neg Hx    Stomach cancer Neg Hx    Rectal cancer Neg Hx     Social History   Socioeconomic History   Marital status: Married    Spouse name: Not on file   Number of children: Not on file   Years of education: Not on file   Highest education level: Not on file  Occupational History   Not on file  Tobacco Use   Smoking status: Never   Smokeless tobacco: Never  Vaping Use   Vaping Use: Never used  Substance and Sexual Activity   Alcohol use: Yes    Alcohol/week: 5.0 standard drinks    Types: 5 Standard drinks or equivalent per week   Drug use: No   Sexual activity: Yes    Comment: with  spouse  Other Topics Concern   Not on file  Social History Narrative   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      Home Situation: lives with spouse      Spiritual Beliefs: none      Lifestyle: walking several times per week for 30 minutes, working on diet - cutting back on portion sizes, also replaced lunch with meal bar and shake and has been losing some weight            Social Determinants of Radio broadcast assistant Strain: Not on file  Food Insecurity: Not on file  Transportation Needs: Not on file  Physical Activity: Not on file  Stress: Not on file  Social Connections: Not on file  Intimate Partner Violence: Not on file    Physical Exam: Vital signs in last 24 hours: BP 121/70   Pulse 72   Temp 98.4 F (36.9 C)   Ht '5\' 2"'$  (1.575 m)   Wt 179 lb (81.2 kg)   SpO2 100%   BMI 32.74 kg/m  GEN: NAD EYE: Sclerae anicteric ENT: MMM CV: Non-tachycardic Pulm: No increased work of breathing GI: Soft, NT/ND NEURO:  Alert & Oriented   [name], MD [facility]  04/10/2022 8:07 AM

## 2022-04-10 NOTE — Progress Notes (Signed)
To pacu, VSS. Report to [email or web address] 

## 2022-04-10 NOTE — Patient Instructions (Signed)
HANDOUT FOR HEMORRHOIDS GIVEN.   TAKE ANUSOL HC CREAM RECTALLY TWICE A DAY FOR 7 DAYS.    YOU HAD AN ENDOSCOPIC PROCEDURE TODAY AT [facility]:   Refer to the procedure report that was given to you for any specific questions about what was found during the examination.  If the procedure report does not answer your questions, please call your gastroenterologist to clarify.  If you requested that your care partner not be given the details of your procedure findings, then the procedure report has been included in a sealed envelope for you to review at your convenience later.  YOU SHOULD EXPECT: Some feelings of bloating in the abdomen. Passage of more gas than usual.  Walking can help get rid of the air that was put into your GI tract during the procedure and reduce the bloating. If you had a lower endoscopy (such as a colonoscopy or flexible sigmoidoscopy) you may notice spotting of blood in your stool or on the toilet paper. If you underwent a bowel prep for your procedure, you may not have a normal bowel movement for a few days.  Please Note:  You might notice some irritation and congestion in your nose or some drainage.  This is from the oxygen used during your procedure.  There is no need for concern and it should clear up in a day or so.  SYMPTOMS TO REPORT IMMEDIATELY:  Following lower endoscopy (colonoscopy or flexible sigmoidoscopy):  Excessive amounts of blood in the stool  Significant tenderness or worsening of abdominal pains  Swelling of the abdomen that is new, acute  Fever of 100F or higher   For urgent or emergent issues, a gastroenterologist can be reached at any hour by calling [phone]. Do not use MyChart messaging for urgent concerns.    DIET:  We do recommend a small meal at first, but then you may proceed to your regular diet.  Drink plenty of fluids but you should avoid alcoholic beverages for 24 hours.  ACTIVITY:  You should plan to take it  easy for the rest of today and you should NOT DRIVE or use heavy machinery until tomorrow (because of the sedation medicines used during the test).    FOLLOW UP: Our staff will call the number listed on your records 48-72 hours following your procedure to check on you and address any questions or concerns that you may have regarding the information given to you following your procedure. If we do not reach you, we will leave a message.  We will attempt to reach you two times.  During this call, we will ask if you have developed any symptoms of COVID 19. If you develop any symptoms (ie: fever, flu-like symptoms, shortness of breath, cough etc.) before then, please call [phone].  If you test positive for Covid 19 in the 2 weeks post procedure, please call and report this information to [place].    If any biopsies were taken you will be contacted by phone or by letter within the next 1-3 weeks.  Please call us at [phone] if you have not heard about the biopsies in 3 weeks.    SIGNATURES/CONFIDENTIALITY: You and/or your care partner have signed paperwork which will be entered into your electronic medical record.  These signatures attest to the fact that that the information above on your After Visit Summary has been reviewed and is understood.  Full responsibility of the confidentiality of this discharge information lies with you and/or your  care-partner.  

## 2022-04-10 NOTE — Op Note (Signed)
[place] Patient Name: Kayla Mccall Procedure Date: 04/10/2022 8:02 AM MRN: [id number] Endoscopist: Sonny Masters "Kayla Mccall ,  Age: 46 Referring MD:  Date of Birth: [birth year] Gender: Female Account #: [id number] Procedure:                Colonoscopy Indications:              Screening for colorectal malignant neoplasm, This                            is the patient's first colonoscopy Medicines:                Monitored Anesthesia Care Procedure:                Pre-Anesthesia Assessment:                           - Prior to the procedure, a History and Physical                            was performed, and patient medications and                            allergies were reviewed. The patient's tolerance of                            previous anesthesia was also reviewed. The risks                            and benefits of the procedure and the sedation                            options and risks were discussed with the patient.                            All questions were answered, and informed consent                            was obtained. Prior Anticoagulants: The patient has                            taken no previous anticoagulant or antiplatelet                            agents. ASA Grade Assessment: II - A patient with                            mild systemic disease. After reviewing the risks                            and benefits, the patient was deemed in                            satisfactory condition to undergo the procedure.  After obtaining informed consent, the colonoscope                            was passed under direct vision. Throughout the                            procedure, the patient's blood pressure, pulse, and                            oxygen saturations were monitored continuously. The                            Olympus PCF-[id number] (#[id number]) Colonoscope was                            introduced through the  anus and advanced to the the                            terminal ileum. The colonoscopy was performed                            without difficulty. The patient tolerated the                            procedure well. The quality of the bowel                            preparation was good. The terminal ileum, ileocecal                            valve, appendiceal orifice, and rectum were                            photographed. Scope In: 8:12:29 AM Scope Out: 8:31:26 AM Scope Withdrawal Time: 0 hours 12 minutes 33 seconds  Total Procedure Duration: 0 hours 18 minutes 57 seconds  Findings:                 The terminal ileum appeared normal.                           Non-bleeding internal hemorrhoids were found during                            retroflexion.                           The exam was otherwise without abnormality. Complications:            No immediate complications. Estimated Blood Loss:     Estimated blood loss: none. Impression:               - The examined portion of the ileum was normal.                           - Non-bleeding internal hemorrhoids.                           -  The examination was otherwise normal.                           - No specimens collected. Recommendation:           - Discharge patient to home (with escort).                           - Repeat colonoscopy in 10 years for screening                            purposes.                           - The findings and recommendations were discussed                            with the patient. Sonny Masters "Kayla Mccall,  04/10/2022 8:42:45 AM

## 2022-04-11 ENCOUNTER — Telehealth: Payer: Self-pay | Admitting: *Deleted

## 2022-04-11 NOTE — Telephone Encounter (Signed)
No answer for post procedure call back. Left VM. 

## 2022-04-11 NOTE — Telephone Encounter (Signed)
No answer at 2nd attempt follow up phone call.  Left message on voicemail.

## 2022-05-26 ENCOUNTER — Other Ambulatory Visit: Payer: Self-pay | Admitting: Pediatric Radiology

## 2022-06-10 ENCOUNTER — Other Ambulatory Visit: Payer: Self-pay | Admitting: Family Medicine

## 2022-06-10 DIAGNOSIS — Z1231 Encounter for screening mammogram for malignant neoplasm of breast: Secondary | ICD-10-CM

## 2022-06-17 ENCOUNTER — Ambulatory Visit
Admission: RE | Admit: 2022-06-17 | Discharge: 2022-06-17 | Disposition: A | Discharge status: Home / Self Care | Payer: Managed Care, Other (non HMO) | Source: Ambulatory Visit | Attending: Family Medicine | Admitting: Family Medicine

## 2022-06-17 DIAGNOSIS — Z1231 Encounter for screening mammogram for malignant neoplasm of breast: Secondary | ICD-10-CM

## 2022-06-18 NOTE — Progress Notes (Signed)
Ok so do I need to place the orders for diagnostic mammogram and US breast?

## 2022-06-19 ENCOUNTER — Other Ambulatory Visit: Payer: Self-pay | Admitting: Family Medicine

## 2022-06-19 DIAGNOSIS — R928 Other abnormal and inconclusive findings on diagnostic imaging of breast: Secondary | ICD-10-CM

## 2022-07-08 ENCOUNTER — Other Ambulatory Visit: Payer: Self-pay | Admitting: Family Medicine

## 2022-07-08 ENCOUNTER — Ambulatory Visit: Payer: Managed Care, Other (non HMO) | Admitting: Family Medicine

## 2022-07-08 ENCOUNTER — Ambulatory Visit
Admission: RE | Admit: 2022-07-08 | Discharge: 2022-07-08 | Disposition: A | Discharge status: Home / Self Care | Payer: Managed Care, Other (non HMO) | Source: Ambulatory Visit | Attending: Family Medicine | Admitting: Family Medicine

## 2022-07-08 ENCOUNTER — Other Ambulatory Visit: Payer: Managed Care, Other (non HMO)

## 2022-07-08 ENCOUNTER — Encounter: Payer: Self-pay | Admitting: Family Medicine

## 2022-07-08 VITALS — BP 112/62 | HR 70 | Temp 97.9°F | Ht 62.0 in | Wt 179.9 lb

## 2022-07-08 DIAGNOSIS — Z6832 Body mass index (BMI) 32.0-32.9, adult: Secondary | ICD-10-CM

## 2022-07-08 DIAGNOSIS — N6310 Unspecified lump in the right breast, unspecified quadrant: Secondary | ICD-10-CM

## 2022-07-08 DIAGNOSIS — J386 Stenosis of larynx: Secondary | ICD-10-CM | POA: Diagnosis not present

## 2022-07-08 DIAGNOSIS — R232 Flushing: Secondary | ICD-10-CM | POA: Diagnosis not present

## 2022-07-08 DIAGNOSIS — R928 Other abnormal and inconclusive findings on diagnostic imaging of breast: Secondary | ICD-10-CM

## 2022-07-08 LAB — COMPREHENSIVE METABOLIC PANEL
ALT: 17 U/L (ref 0–35)
AST: 16 U/L (ref 0–37)
Albumin: 4.6 g/dL (ref 3.5–5.2)
Alkaline Phosphatase: 45 U/L (ref 39–117)
BUN: 10 mg/dL (ref 6–23)
CO2: 27 mEq/L (ref 19–32)
Calcium: 10.1 mg/dL (ref 8.4–10.5)
Chloride: 104 mEq/L (ref 96–112)
Creatinine, Ser: 0.78 mg/dL (ref 0.40–1.20)
GFR: 91.71 mL/min (ref >60.00)
Glucose, Bld: 73 mg/dL (ref 70–99)
Potassium: 5 mEq/L (ref 3.5–5.1)
Sodium: 143 mEq/L (ref 135–145)
Total Bilirubin: 0.4 mg/dL (ref 0.2–1.2)
Total Protein: 7.4 g/dL (ref 6.0–8.3)

## 2022-07-08 LAB — CBC WITH DIFFERENTIAL/PLATELET
Basophils Absolute: 0 10*3/uL (ref 0.0–0.1)
Basophils Relative: 0.5 % (ref 0.0–3.0)
Eosinophils Absolute: 0.1 10*3/uL (ref 0.0–0.7)
Eosinophils Relative: 0.9 % (ref 0.0–5.0)
HCT: 39.9 % (ref 36.0–46.0)
Hemoglobin: 13.3 g/dL (ref 12.0–15.0)
Lymphocytes Relative: 28.5 % (ref 12.0–46.0)
Lymphs Abs: 2.1 10*3/uL (ref 0.7–4.0)
MCHC: 33.4 g/dL (ref 30.0–36.0)
MCV: 91.1 fl (ref 78.0–100.0)
Monocytes Absolute: 0.7 10*3/uL (ref 0.1–1.0)
Monocytes Relative: 9.1 % (ref 3.0–12.0)
Neutro Abs: 4.5 10*3/uL (ref 1.4–7.7)
Neutrophils Relative %: 61 % (ref 43.0–77.0)
Platelets: 324 10*3/uL (ref 150.0–400.0)
RBC: 4.38 Mil/uL (ref 3.87–5.11)
RDW: 13.3 % (ref 11.5–15.5)
WBC: 7.4 10*3/uL (ref 4.0–10.5)

## 2022-07-08 LAB — LIPID PANEL
Cholesterol: 145 mg/dL (ref 0–200)
HDL: 71.5 mg/dL (ref >39.00)
LDL Cholesterol: 56 mg/dL (ref 0–99)
NonHDL: 73.09
Total CHOL/HDL Ratio: 2
Triglycerides: 84 mg/dL (ref 0.0–149.0)
VLDL: 16.8 mg/dL (ref 0.0–40.0)

## 2022-07-08 LAB — TSH: TSH: 1.93 u[IU]/mL (ref 0.35–5.50)

## 2022-07-08 NOTE — Progress Notes (Signed)
Established Patient Office Visit  Subjective   Patient ID: Kayla Mccall, female    DOB: 02/25/1976  Age: 46 y.o. MRN: [id number]  Chief Complaint  Patient presents with   Establish Care    Patient is here for transition of care visit.   Patient reports she has a long history of breathing issues. States that over the last year she was having increasing SOB. Was seen by pulmonary and ENT, was treated with inhalers which were not effective, states that she was told she had subglottic stenosis. Had the surgery in April 2023, has had follow ups with the ENT and had a second surgery June 30, 2022. States that she had some postoperative pain with the most recent procedure. States she hasn't noticed much of a difference in her breathing after the second procedure. States after the first procedure her breathing did improve a significant amount. Has a follow up scheduled with her ENT soon.   Hot flashes-- patient is reporting she is having more missed periods and increasing hot flashes. States that she doesn't really want hormonal therapy. We discussed other options for treatment of pre/perimenopausal symptoms.    Current Outpatient Medications  Medication Instructions   Ascorbic Acid (VITAMIN C PO) Oral   Budeson-Glycopyrrol-Formoterol (BREZTRI AEROSPHERE) 160-9-4.8 MCG/ACT AERO 2 puffs, Inhalation, 2 times daily   Multiple Vitamin (MULTI-VITAMIN) tablet 1 tablet, Oral, Daily   Naproxen Sodium (ALEVE PO) Oral, As needed   Spacer/Aero-Holding Chambers (AEROCHAMBER PLUS) inhaler Use as instructed       Review of Systems  Constitutional:  Negative for chills, fever and malaise/fatigue.  HENT:  Positive for congestion and sore throat.   Eyes:  Negative for blurred vision.  Respiratory:  Negative for cough and shortness of breath.   Cardiovascular:  Negative for chest pain.  Gastrointestinal:  Negative for heartburn and nausea.  All other systems reviewed and are negative.      Objective:     BP 112/62 (BP Location: Left Arm, Patient Position: Sitting, Cuff Size: Large)   Pulse 70   Temp 97.9 F (36.6 C) (Oral)   Ht '5\' 2"'$  (1.575 m)   Wt 179 lb 14.4 oz (81.6 kg)   LMP 07/06/2022 (Exact Date)   SpO2 97%   BMI 32.90 kg/m    Physical Exam Vitals reviewed.  Constitutional:      Appearance: Normal appearance. She is well-groomed. She is obese.  Eyes:     Extraocular Movements: Extraocular movements intact.     Conjunctiva/sclera: Conjunctivae normal.  Neck:     Thyroid: No thyromegaly.  Cardiovascular:     Rate and Rhythm: Normal rate and regular rhythm.     Pulses: Normal pulses.     Heart sounds: S1 normal and S2 normal.  Pulmonary:     Effort: Pulmonary effort is normal.     Breath sounds: Normal breath sounds and air entry.  Abdominal:     General: Bowel sounds are normal.     Palpations: Abdomen is soft.  Musculoskeletal:        General: Normal range of motion.     Cervical back: Neck supple.     Right lower leg: No edema.     Left lower leg: No edema.  Skin:    General: Skin is warm and dry.  Neurological:     Mental Status: She is alert and oriented to person, place, and time. Mental status is at baseline.     Gait: Gait is intact.  Psychiatric:  Mood and Affect: Mood and affect normal.        Speech: Speech normal.        Behavior: Behavior normal.        Judgment: Judgment normal.      No results found for any visits on 07/08/22.    The 10-year ASCVD risk score (Arnett DK, et al., 2019) is: 0.3%    Assessment & Plan:   Problem List Items Addressed This Visit       Cardiovascular and Mediastinum   Hot flashes    We discussed herbal supplements including black cohosh that could help, we also discussed starting SSRI/SNRI for treatment of hot flashes. Patient wants to wait for the results of her bloodwork first, checking TSH, CBC, CMP today.      Relevant Orders   CBC with Differential/Platelets   TSH   CMP      Respiratory   Acquired subglottic stenosis (Chronic)    S/p 2 surgeries, following up with ENT next month. We discussed using zyrtec and pepcid daily to see if this would help with her symptoms of mucus in her throat. I advised her to try these medications for at least a month.      Other Visit Diagnoses     BMI 32.0-32.9,adult    -  Primary   Relevant Orders   Lipid Panel       Return in about 1 year (around 07/09/2023) for annual physical exam.    Kayla Conners, MD

## 2022-07-08 NOTE — Assessment & Plan Note (Signed)
S/p 2 surgeries, following up with ENT next month. We discussed using zyrtec and pepcid daily to see if this would help with her symptoms of mucus in her throat. I advised her to try these medications for at least a month.

## 2022-07-08 NOTE — Assessment & Plan Note (Addendum)
We discussed herbal supplements including black cohosh that could help, we also discussed starting SSRI/SNRI for treatment of hot flashes. Patient wants to wait for the results of her bloodwork first, checking TSH, CBC, CMP today.

## 2022-07-08 NOTE — Patient Instructions (Signed)
Zyrtec 10 mg every day before bed  Pepcid 20 mg or 40 mg every morning.

## 2022-07-09 NOTE — Progress Notes (Signed)
They are recommending a biopsy-- does she need a referral to the breast surgeon for this?

## 2022-07-15 ENCOUNTER — Telehealth: Payer: Self-pay | Admitting: Family Medicine

## 2022-07-15 ENCOUNTER — Ambulatory Visit
Admission: RE | Admit: 2022-07-15 | Discharge: 2022-07-15 | Disposition: A | Discharge status: Home / Self Care | Payer: Managed Care, Other (non HMO) | Source: Ambulatory Visit | Attending: Family Medicine

## 2022-07-15 DIAGNOSIS — N6310 Unspecified lump in the right breast, unspecified quadrant: Secondary | ICD-10-CM

## 2022-07-15 NOTE — Telephone Encounter (Signed)
Spoke with Kayla Mccall and requested the fax be sent directly to me at the pod.  Fax was received and placed in the folder for review by PCP.

## 2022-07-15 NOTE — Telephone Encounter (Signed)
[facility]ika called stating pt is currently in the office and they will need a verbal order signature in order for the pt to be seen. Timika stated she faxed over the form last week and again today.   [phone] Fax [phone]  Please advise.

## 2022-07-15 NOTE — Telephone Encounter (Signed)
Signed order by PCP was faxed to the [facility] at [phone], confirmation received and sent to be scanned.

## 2023-02-23 IMAGING — MG DIGITAL DIAGNOSTIC BILAT W/ TOMO W/ CAD
6 of 10 series · 6 of 30 positions shown · non-contrast
Comparison: Previous exam(s).

CLINICAL DATA: Patient describes a palpable lump within the RIGHT
breast for 2 months. History of RIGHT breast cysts.

EXAM:
DIGITAL DIAGNOSTIC BILATERAL MAMMOGRAM WITH TOMOSYNTHESIS AND CAD;
ULTRASOUND RIGHT BREAST LIMITED
TECHNIQUE: Bilateral digital diagnostic mammography and breast tomosynthesis
was performed. The images were evaluated with computer-aided
detection.; Targeted ultrasound examination of the right breast was
performed

[R MLO synth-2D (1 of 2)]
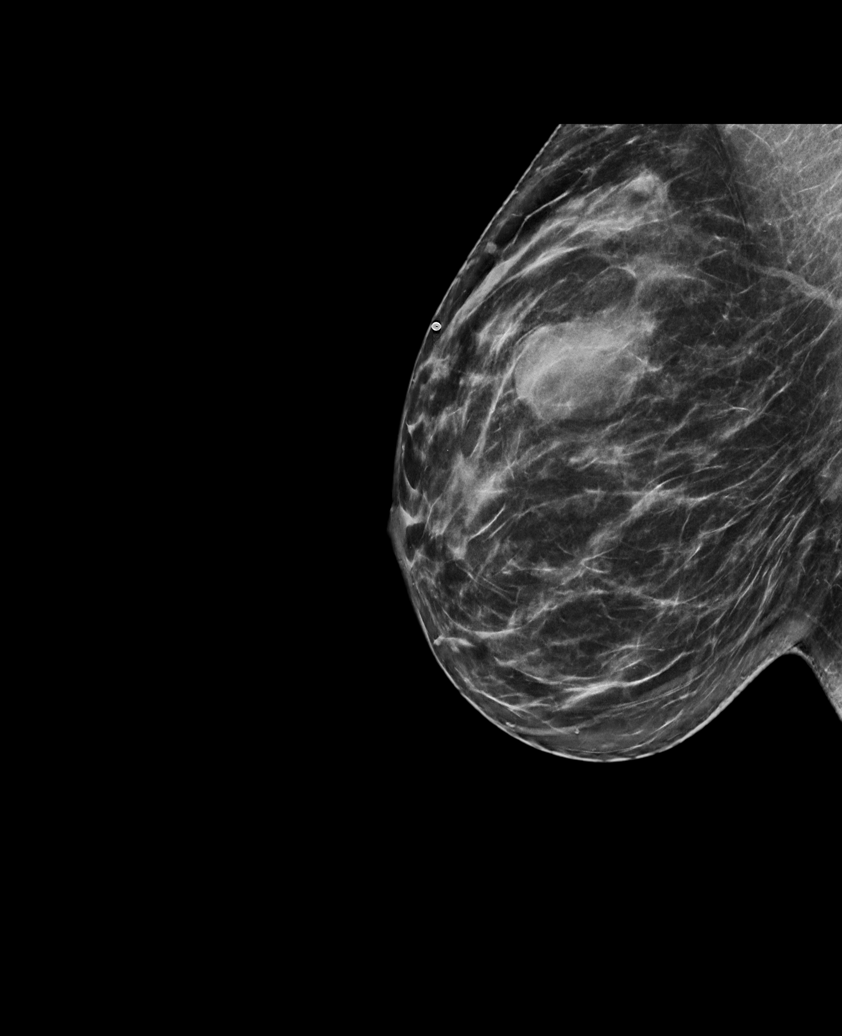

[R MLO synth-2D (2 of 2)]
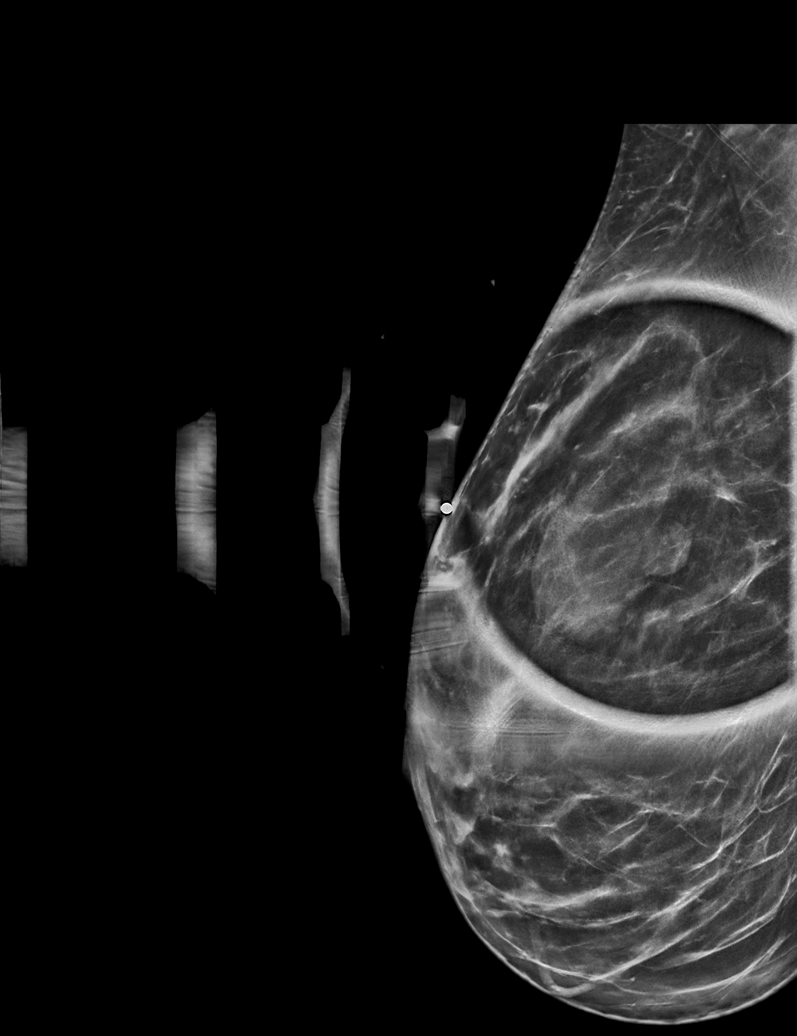

[L MLO synth-2D]
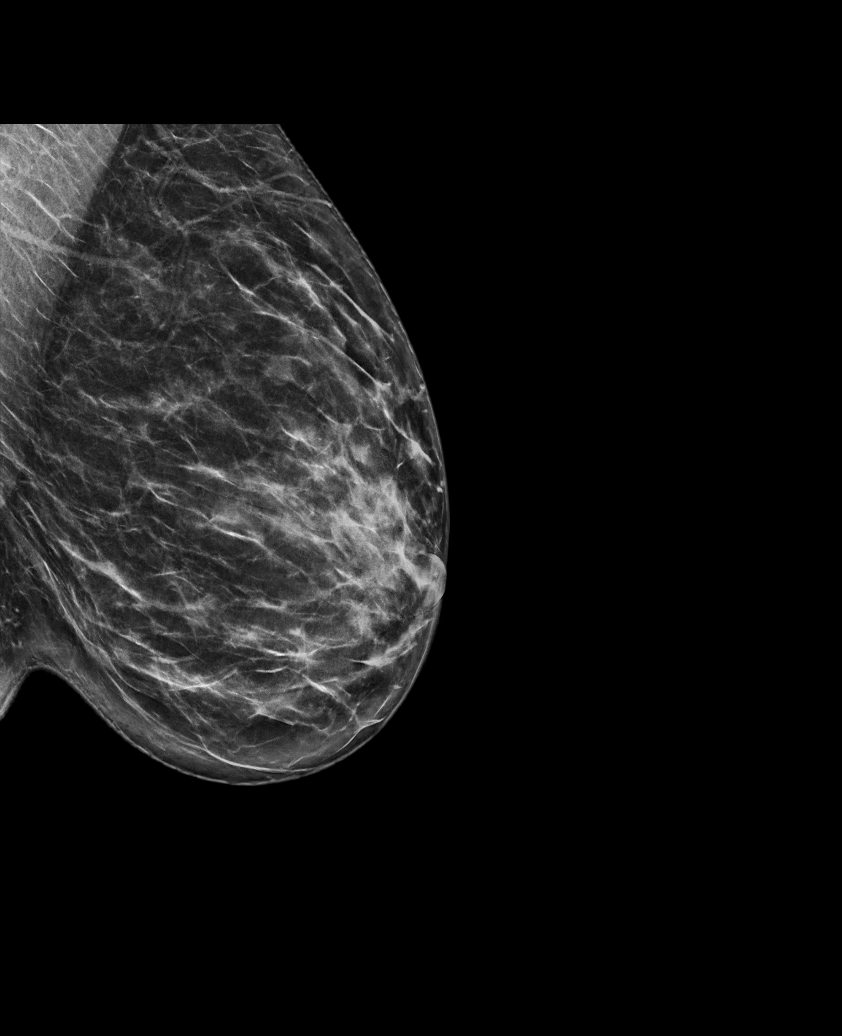

[R CC synth-2D]
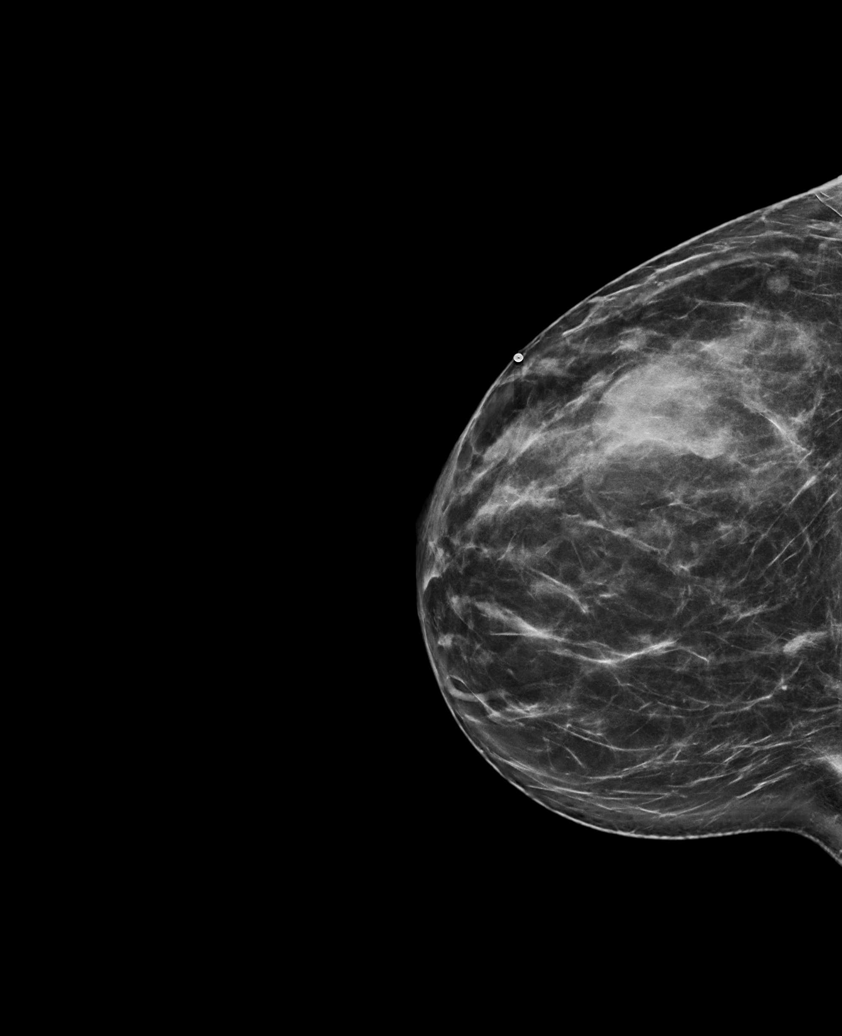

[L CC synth-2D]
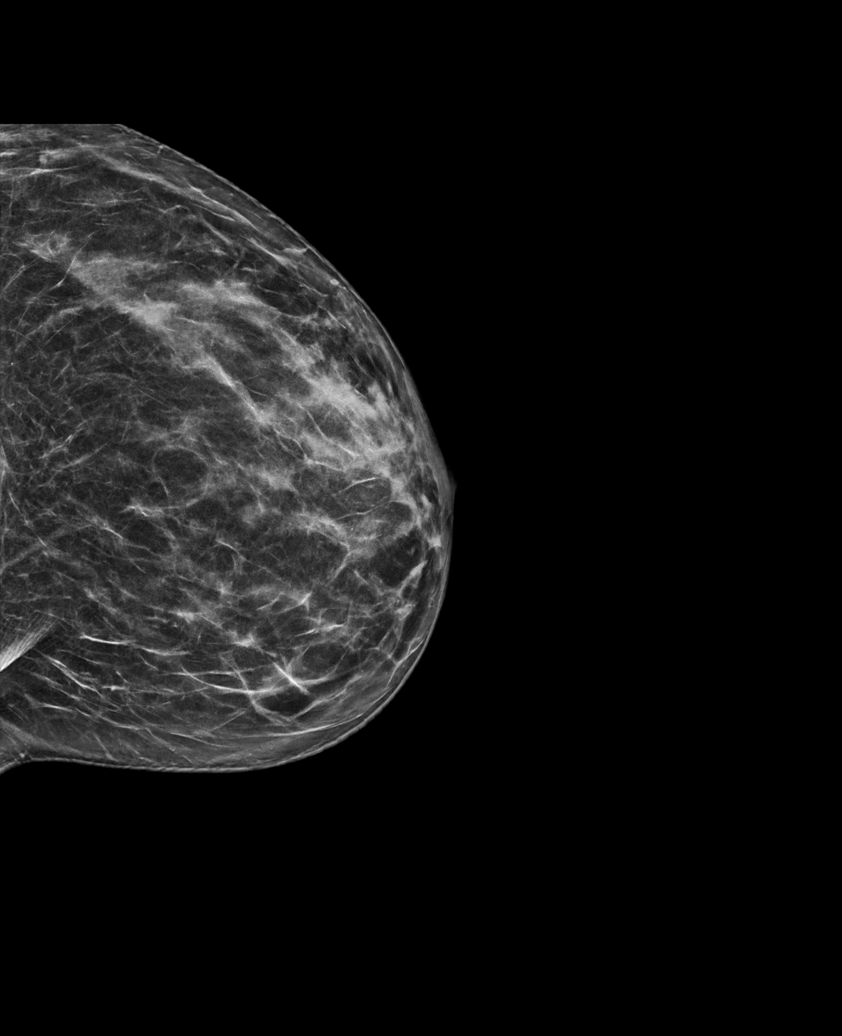

[R CC tomo · tomo slice 39/76.0]
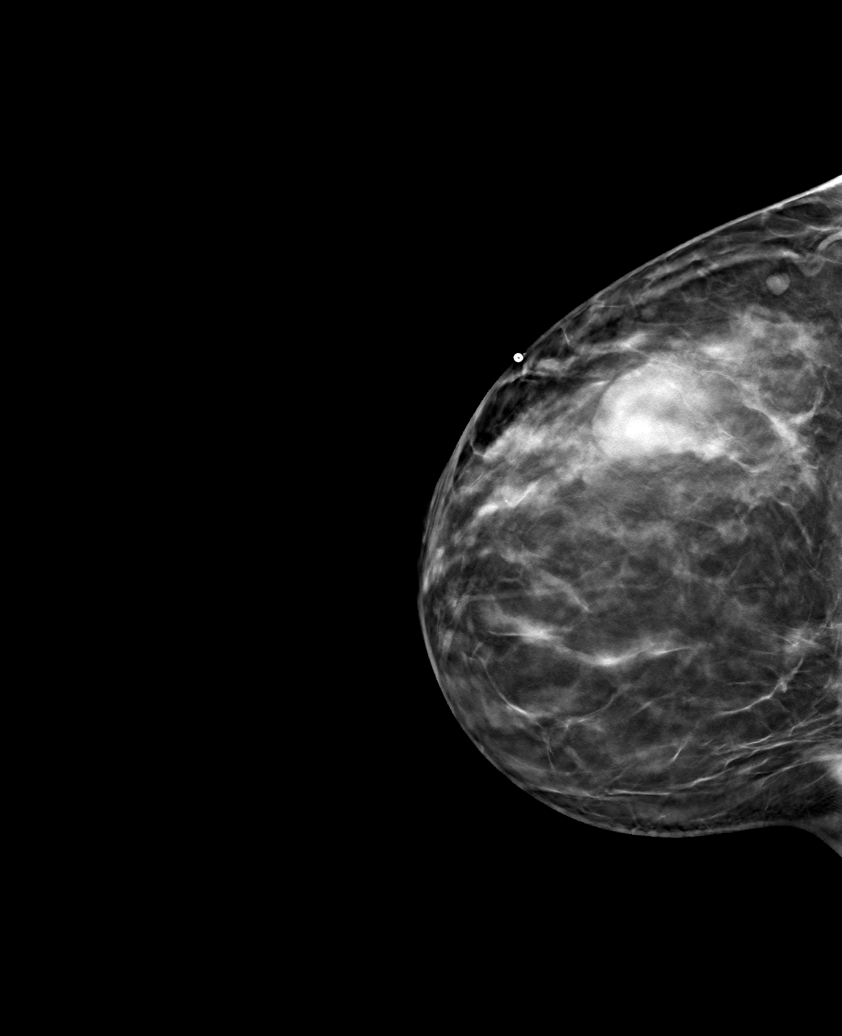

[6 of 30 positions shown; findings below may reference images not displayed]

ACR Breast Density Category c: The breast tissue is heterogeneously
dense, which may obscure small masses.
FINDINGS: Bilateral diagnostic mammogram:

There is an oval circumscribed mass within the upper-outer quadrant
of the RIGHT breast, measuring approximately 3 cm greatest
dimension, corresponding to the palpable area of concern with
overlying skin marker in place. There are no new dominant masses,
suspicious calcifications or secondary signs of malignancy elsewhere
within the RIGHT breast.

There are no new dominant masses, suspicious calcifications or
secondary signs of malignancy within the LEFT breast.

Targeted ultrasound is performed, evaluating the upper RIGHT breast
as directed by the patient, showing a benign cyst versus 2 abutting
cysts within the RIGHT breast at the 11:30 o'clock axis, 3 cm from
the nipple, measuring 3.4 x 1.8 x 3.1 cm, corresponding to the
palpable area of concern. No additional solid or cystic mass is seen
by ultrasound.
IMPRESSION: No evidence of malignancy. Benign cyst within the RIGHT breast at
the 11:30 o'clock axis, measuring 3.4 cm, corresponding to the
palpable area of concern.

RECOMMENDATION:
1.  Screening mammogram in one year.(Code:V8-U-158)
2. The patient was instructed to return sooner if the area that she
feels becomes larger and/or firmer to palpation, or if a new
palpable abnormality is identified in either breast.

I have discussed the findings and recommendations with the patient.
If applicable, a reminder letter will be sent to the patient
regarding the next appointment.

BI-RADS CATEGORY  2: Benign.

## 2023-02-23 IMAGING — US US BREAST*R* LIMITED INC AXILLA
1 series · 5 of 5 positions shown · non-contrast
Comparison: Previous exam(s).

CLINICAL DATA: Patient describes a palpable lump within the RIGHT
breast for 2 months. History of RIGHT breast cysts.

EXAM:
DIGITAL DIAGNOSTIC BILATERAL MAMMOGRAM WITH TOMOSYNTHESIS AND CAD;
ULTRASOUND RIGHT BREAST LIMITED
TECHNIQUE: Bilateral digital diagnostic mammography and breast tomosynthesis
was performed. The images were evaluated with computer-aided
detection.; Targeted ultrasound examination of the right breast was
performed

[Series 1: us breast*right* limited inc axilla · 0.09mm/px · 5 of 5 slices shown]
[im 1/5]
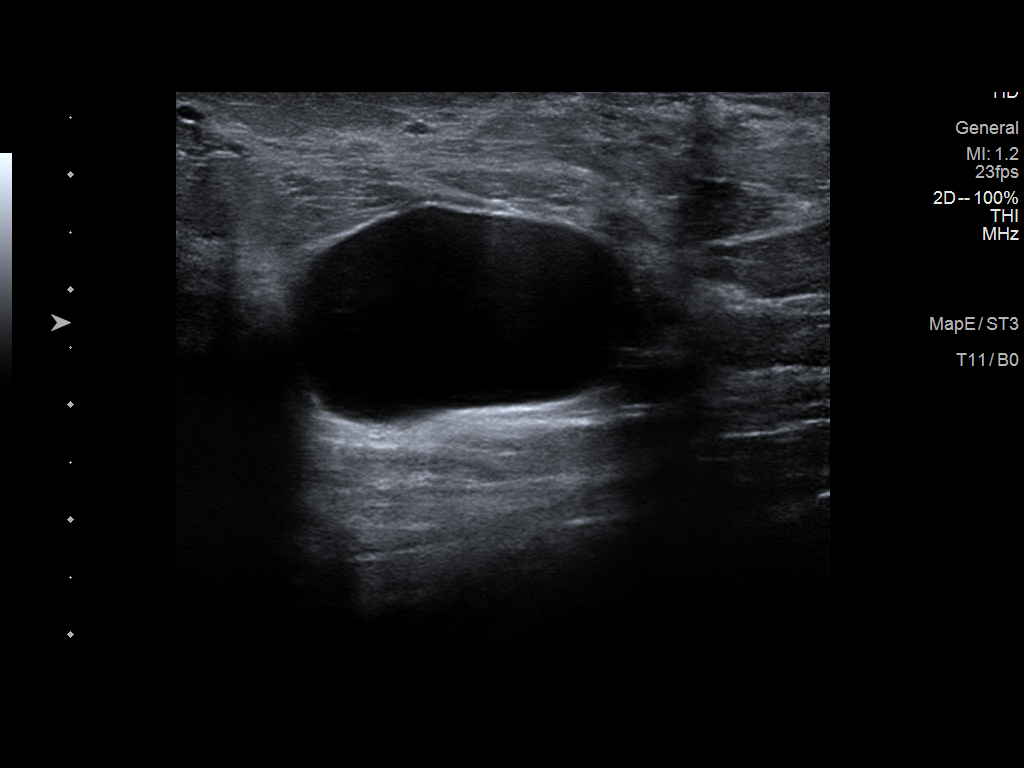
[im 2/5]
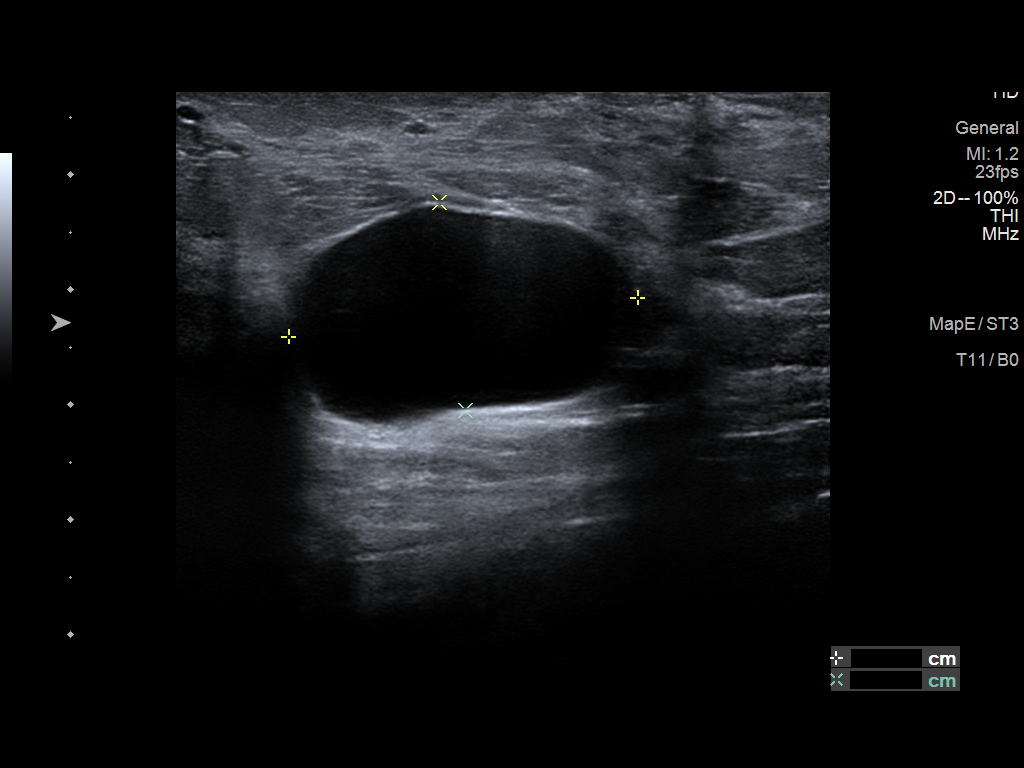
[im 3/5]
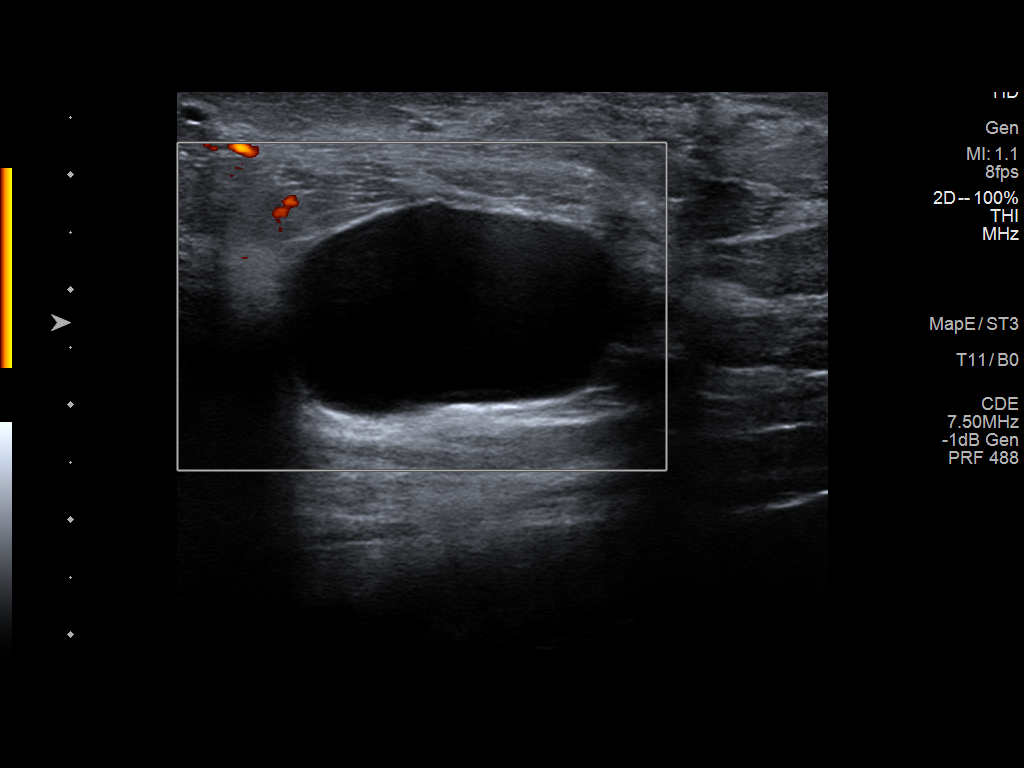
[im 4/5]
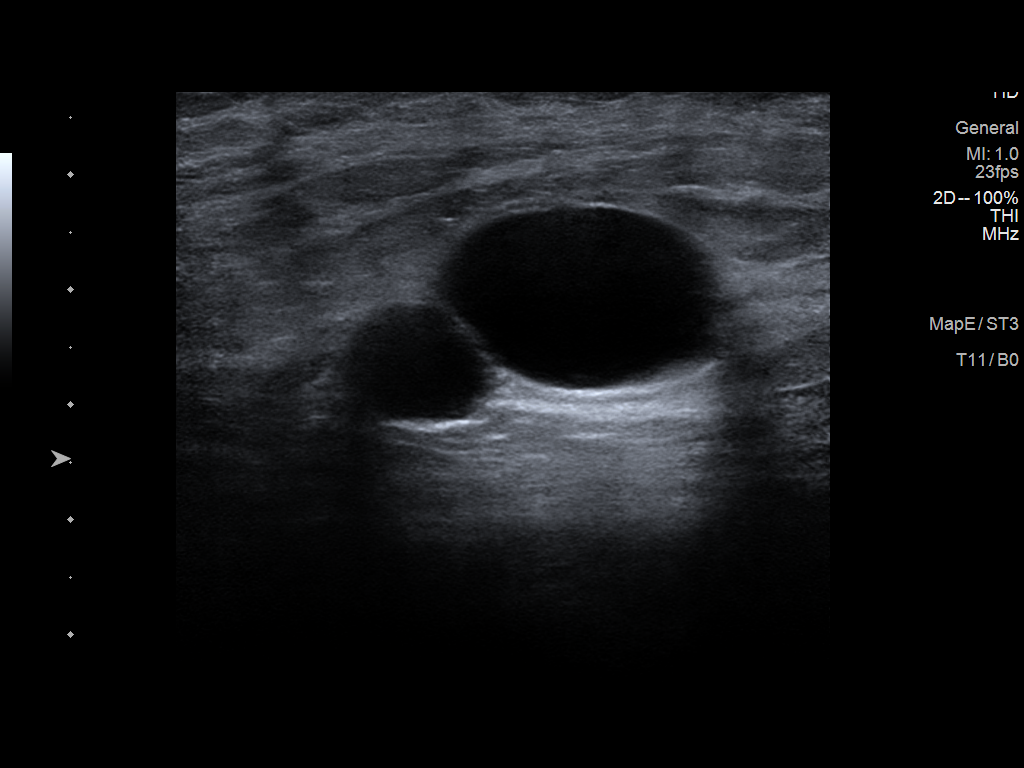
[im 5/5]
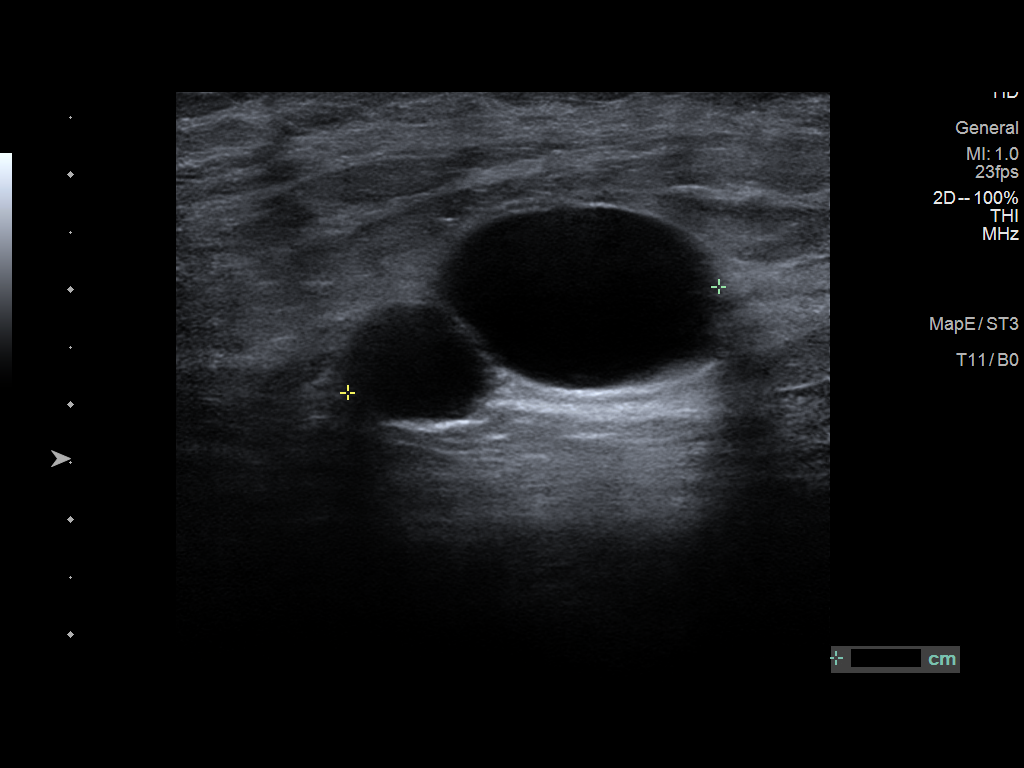

[5 of 5 positions shown; findings below may reference images not displayed]

ACR Breast Density Category c: The breast tissue is heterogeneously
dense, which may obscure small masses.
FINDINGS: Bilateral diagnostic mammogram:

There is an oval circumscribed mass within the upper-outer quadrant
of the RIGHT breast, measuring approximately 3 cm greatest
dimension, corresponding to the palpable area of concern with
overlying skin marker in place. There are no new dominant masses,
suspicious calcifications or secondary signs of malignancy elsewhere
within the RIGHT breast.

There are no new dominant masses, suspicious calcifications or
secondary signs of malignancy within the LEFT breast.

Targeted ultrasound is performed, evaluating the upper RIGHT breast
as directed by the patient, showing a benign cyst versus 2 abutting
cysts within the RIGHT breast at the 11:30 o'clock axis, 3 cm from
the nipple, measuring 3.4 x 1.8 x 3.1 cm, corresponding to the
palpable area of concern. No additional solid or cystic mass is seen
by ultrasound.
IMPRESSION: No evidence of malignancy. Benign cyst within the RIGHT breast at
the 11:30 o'clock axis, measuring 3.4 cm, corresponding to the
palpable area of concern.

RECOMMENDATION:
1.  Screening mammogram in one year.(Code:V8-U-158)
2. The patient was instructed to return sooner if the area that she
feels becomes larger and/or firmer to palpation, or if a new
palpable abnormality is identified in either breast.

I have discussed the findings and recommendations with the patient.
If applicable, a reminder letter will be sent to the patient
regarding the next appointment.

BI-RADS CATEGORY  2: Benign.

## 2023-05-05 ENCOUNTER — Other Ambulatory Visit: Payer: Self-pay | Admitting: Family Medicine

## 2023-05-05 DIAGNOSIS — Z1231 Encounter for screening mammogram for malignant neoplasm of breast: Secondary | ICD-10-CM

## 2023-05-13 ENCOUNTER — Encounter: Payer: Self-pay | Admitting: Family Medicine

## 2023-05-13 ENCOUNTER — Ambulatory Visit: Payer: Managed Care, Other (non HMO) | Admitting: Family Medicine

## 2023-05-13 VITALS — BP 118/82 | HR 70 | Temp 98.8°F | Wt 198.0 lb

## 2023-05-13 DIAGNOSIS — R197 Diarrhea, unspecified: Secondary | ICD-10-CM

## 2023-05-13 DIAGNOSIS — M545 Low back pain, unspecified: Secondary | ICD-10-CM

## 2023-05-13 LAB — CBC WITH DIFFERENTIAL/PLATELET
Basophils Absolute: 28 cells/uL (ref 0–200)
Eosinophils Relative: 16.9 %
MCH: 30.3 pg (ref 27.0–33.0)
Neutro Abs: 4775 cells/uL (ref 1500–7800)
Neutrophils Relative %: 51.9 %
RDW: 12.1 % (ref 11.0–15.0)
Total Lymphocyte: 23.7 %
WBC: 9.2 10*3/uL (ref 3.8–10.8)

## 2023-05-13 LAB — POCT URINALYSIS DIPSTICK
Bilirubin, UA: NEGATIVE
Glucose, UA: NEGATIVE
Ketones, UA: NEGATIVE
Leukocytes, UA: NEGATIVE
Nitrite, UA: NEGATIVE
Protein, UA: POSITIVE — AB
Spec Grav, UA: 1.025 (ref 1.010–1.025)
Urobilinogen, UA: NEGATIVE E.U./dL — AB
pH, UA: 6 (ref 5.0–8.0)

## 2023-05-13 NOTE — Addendum Note (Signed)
Addended by: Elwin Mocha on: 05/13/2023 04:40 PM   Modules accepted: Orders

## 2023-05-13 NOTE — Addendum Note (Signed)
Addended by: Marian Sorrow D on: 05/13/2023 04:11 PM   Modules accepted: Orders

## 2023-05-13 NOTE — Addendum Note (Signed)
Addended by: Marian Sorrow D on: 05/13/2023 04:34 PM   Modules accepted: Orders

## 2023-05-13 NOTE — Progress Notes (Signed)
Established Patient Office Visit   Subjective  Patient ID: Kayla Mccall, female    DOB: [birth year]  Age: 47 y.o. MRN: [id number]  Chief Complaint  Patient presents with   Diarrhea    Stomach started feeling like needles on thurs, then felt nauseated on fri, but did not vomit. Bowels have been a tan color not  dark brown as usual. Last night was up and down, one night was not ale to make it to toilet. Also had pain in lower back and in pelvic area. Has just been feeling flustered, but no fever. Took some aleve to see if it would help stomach , and also tried airborne. Diarrhea has been both liquid and some solid.     Patient is a 47 year old female followed with Dr. Casimiro Needle and seen for acute concern.  Patient endorses diarrhea x 6 days.  Had 3 stools today.  Symptoms started last Thursday after eating [employer].  Patient developed a needle sensation in stomach and cramping.  Symptoms then increased to include diarrhea both loose and formed stools, chills, nausea, increased flatus, bloating, and flushed sensation.  Patient notes lighter color to stools.  Low back hurting intermittently.  Appetite is okay.  Denies urinary symptoms.  Tried Benadryl, Aleve, and Airborne for symptoms.  Denies sick contacts, recent travel.  Patient has dogs that they are well.  No one else in patient's household is sick.    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 at [facility]   Varicose veins    Past Surgical History:  Procedure Laterality Date   LARYNGOSCOPY WITH DILATATION  02/24/2022   WISDOM TOOTH EXTRACTION     Social History   Tobacco Use   Smoking status: Never   Smokeless tobacco: Never  Vaping Use   Vaping Use: Never used  Substance Use Topics   Alcohol use: Yes    Alcohol/week: 5.0 standard drinks of alcohol    Types: 5 Standard drinks or equivalent per week    Drug use: No   Family History  Problem Relation Age of Onset   Hyperlipidemia Mother    Diabetes Mother    Other Mother        palpitations   Other Father        suicide   Heart murmur Brother    Arthritis Maternal Grandmother    Diabetes Maternal Grandmother    Healthy Half-Brother    Healthy Half-Sister    Breast cancer Other        maternal great grandmother   Colon cancer Neg Hx    Esophageal cancer Neg Hx    Stomach cancer Neg Hx    Rectal cancer Neg Hx    No Known Allergies    ROS Negative unless stated above    Objective:     BP 118/82 (BP Location: Right Arm, Patient Position: Sitting, Cuff Size: Large)   Pulse 70   Temp 98.8 F (37.1 C) (Oral)   Wt 198 lb (89.8 kg)   SpO2 98%   BMI 36.21 kg/m  BP Readings from Last 3 Encounters:  05/13/23 118/82  07/08/22 112/62  04/10/22 (!) 101/59   Wt Readings from Last 3 Encounters:  05/13/23 198 lb (89.8 kg)  07/08/22 179 lb 14.4 oz (81.6 kg)  04/10/22 179 lb (81.2  kg)      Physical Exam Constitutional:      General: She is not in acute distress.    Appearance: Normal appearance.  HENT:     Head: Normocephalic and atraumatic.     Nose: Nose normal.     Mouth/Throat:     Mouth: Mucous membranes are moist.  Cardiovascular:     Rate and Rhythm: Normal rate and regular rhythm.     Heart sounds: Normal heart sounds. No murmur heard.    No gallop.  Pulmonary:     Effort: Pulmonary effort is normal. No respiratory distress.     Breath sounds: Normal breath sounds. No wheezing, rhonchi or rales.  Abdominal:     General: Abdomen is flat. Bowel sounds are increased.     Palpations: Abdomen is soft.     Tenderness: There is no abdominal tenderness. There is no right CVA tenderness, left CVA tenderness, guarding or rebound.  Skin:    General: Skin is warm and dry.  Neurological:     Mental Status: She is alert and oriented to person, place, and time.      No results found for any visits on  05/13/23.    Assessment & Plan:  Diarrhea of presumed infectious origin -     CBC with Differential/Platelet -     Comprehensive metabolic panel -     POCT urinalysis dipstick -     Cdiff NAA+O+P+Stool Culture  Acute bilateral low back pain without sciatica -     POCT urinalysis dipstick  Will obtain labs including stool studies to evaluate for infectious causes of diarrhea.  Discussed rehydration and OTC meds such as Imodium.  Can also try probiotic for symptoms.  Hand hygiene encouraged.  Return if symptoms worsen or fail to improve.   [name], MD

## 2023-05-13 NOTE — Patient Instructions (Addendum)
Labs were placed including stool culture to evaluate your current symptoms.  While we are waiting on the results which usually take a day or 2, you can take over-the-counter Imodium to help decrease the amount of loose stools you are having.  Can also try an over-the-counter probiotic to help with symptoms.  Staying hydrated is important.

## 2023-05-14 LAB — COMPREHENSIVE METABOLIC PANEL
AG Ratio: 2 (calc) (ref 1.0–2.5)
ALT: 9 U/L (ref 6–29)
AST: 15 U/L (ref 10–35)
Albumin: 4.3 g/dL (ref 3.6–5.1)
Alkaline phosphatase (APISO): 78 U/L (ref 31–125)
BUN: 9 mg/dL (ref 7–25)
CO2: 28 mmol/L (ref 20–32)
Calcium: 9.9 mg/dL (ref 8.6–10.2)
Chloride: 104 mmol/L (ref 98–110)
Creat: 0.79 mg/dL (ref 0.50–0.99)
Globulin: 2.2 g/dL (calc) (ref 1.9–3.7)
Glucose, Bld: 88 mg/dL (ref 65–99)
Potassium: 4.2 mmol/L (ref 3.5–5.3)
Sodium: 139 mmol/L (ref 135–146)
Total Bilirubin: 0.3 mg/dL (ref 0.2–1.2)
Total Protein: 6.5 g/dL (ref 6.1–8.1)

## 2023-05-14 LAB — CBC WITH DIFFERENTIAL/PLATELET
Absolute Monocytes: 662 cells/uL (ref 200–950)
Basophils Relative: 0.3 %
Eosinophils Absolute: 1555 cells/uL — ABNORMAL HIGH (ref 15–500)
HCT: 39.3 % (ref 35.0–45.0)
Hemoglobin: 13.2 g/dL (ref 11.7–15.5)
Lymphs Abs: 2180 cells/uL (ref 850–3900)
MCHC: 33.6 g/dL (ref 32.0–36.0)
MCV: 90.3 fL (ref 80.0–100.0)
MPV: 10.5 fL (ref 7.5–12.5)
Monocytes Relative: 7.2 %
Platelets: 307 10*3/uL (ref 140–400)
RBC: 4.35 10*6/uL (ref 3.80–5.10)

## 2023-05-17 LAB — STOOL CULTURE

## 2023-05-17 LAB — CLOSTRIDIUM DIFFICILE EIA: C difficile Toxins A+B, EIA: NEGATIVE

## 2023-05-19 LAB — STOOL CULTURE: E coli, Shiga toxin Assay: NEGATIVE

## 2023-07-14 ENCOUNTER — Ambulatory Visit: Payer: Managed Care, Other (non HMO)

## 2023-07-15 ENCOUNTER — Ambulatory Visit
Admission: RE | Admit: 2023-07-15 | Discharge: 2023-07-15 | Disposition: A | Discharge status: Home / Self Care | Payer: Managed Care, Other (non HMO) | Source: Ambulatory Visit | Attending: Family Medicine | Admitting: Family Medicine

## 2023-07-15 DIAGNOSIS — Z1231 Encounter for screening mammogram for malignant neoplasm of breast: Secondary | ICD-10-CM

## 2023-08-20 ENCOUNTER — Telehealth: Payer: Managed Care, Other (non HMO) | Admitting: Family Medicine

## 2023-08-20 ENCOUNTER — Encounter: Payer: Self-pay | Admitting: Family Medicine

## 2023-08-20 DIAGNOSIS — U071 COVID-19: Secondary | ICD-10-CM

## 2023-08-20 MED ORDER — NIRMATRELVIR/RITONAVIR (PAXLOVID)TABLET
3.0000 | ORAL_TABLET | Freq: Two times a day (BID) | ORAL | 0 refills | Status: AC
Start: 2023-08-20 — End: 2023-08-25

## 2023-08-20 NOTE — Progress Notes (Signed)
Virtual Visit via Video Note  I connected with Kayla Mccall on 08/20/23 at 10:45 AM EDT by a video enabled telemedicine application and verified that I am speaking with the correct person using two identifiers.  Location patient: home Location provider:work or home office Persons participating in the virtual visit: patient, provider  I discussed the limitations of evaluation and management by telemedicine and the availability of in person appointments. The patient expressed understanding and agreed to proceed. Chief Complaint  Patient presents with   Cough    Started 6 days ago, body aches, cough and ears clogged, headache, congestion, patient has no smell, pt tested positive 2 days ago for coivd     HPI: Pt is a 47 yo female followed by Dr. Casimiro Needle who is seen for acute concern.  Last Friday pt developed scratchy throat, pressure in ears, fatigue.  Pt developed loss of smell 1 day ago.  Can taste but its not the same. Also endorses nasal congestion, HA, productive cough, and loose stools.  Patient had positive home COVID tests 2 days ago.  Pt's Husband with similar symptoms, also positive for COVID. Denies fever, chills, ear pain, nausea, vomiting.  Pt tried allergy meds, aleeve, tummeric soln, and airborne.   ROS: See pertinent positives and negatives per HPI.  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 at [facility]   Varicose veins     Past Surgical History:  Procedure Laterality Date   LARYNGOSCOPY WITH DILATATION  02/24/2022   WISDOM TOOTH EXTRACTION      Family History  Problem Relation Age of Onset   Hyperlipidemia Mother    Diabetes Mother    Other Mother        palpitations   Other Father        suicide   Heart murmur Brother    Arthritis Maternal Grandmother    Diabetes Maternal Grandmother    Healthy Half-Brother    Healthy Half-Sister     Breast cancer Other        maternal great grandmother   Colon cancer Neg Hx    Esophageal cancer Neg Hx    Stomach cancer Neg Hx    Rectal cancer Neg Hx      Current Outpatient Medications:    albuterol (VENTOLIN HFA) 108 (90 Base) MCG/ACT inhaler, Inhale 2 puffs into the lungs every 6 (six) hours as needed., Disp: , Rfl:    cetirizine (ZYRTEC) 10 MG chewable tablet, , Disp: , Rfl:    cetirizine (ZYRTEC) 10 MG tablet, Take by mouth., Disp: , Rfl:    fluticasone (FLONASE) 50 MCG/ACT nasal spray, Place into the nose., Disp: , Rfl:    loratadine (CLARITIN) 10 MG tablet, Take by mouth., Disp: , Rfl:    Multiple Vitamin (MULTI-VITAMIN) tablet, Take 1 tablet by mouth daily., Disp: , Rfl:    Multiple Vitamins-Minerals (AIRBORNE) TBEF, Take by mouth., Disp: , Rfl:    Naproxen Sodium (ALEVE) 220 MG CAPS, as needed., Disp: , Rfl:   EXAM:  VITALS per patient if applicable:  RR between 12-20 bpm  GENERAL: alert, oriented, appears well and in no acute distress  HEENT: atraumatic, conjunctiva clear, no obvious abnormalities on inspection of external nose and ears  NECK: normal movements of the head and neck  LUNGS: on inspection no signs of  respiratory distress, breathing rate appears normal, no obvious gross SOB, gasping or wheezing  CV: no obvious cyanosis  MS: moves all visible extremities without noticeable abnormality  PSYCH/NEURO: pleasant and cooperative, no obvious depression or anxiety, speech and thought processing grossly intact  ASSESSMENT AND PLAN:  Discussed the following assessment and plan:  COVID-19 virus infection - Plan: nirmatrelvir/ritonavir (PAXLOVID) 20 x 150 MG & 10 x 100MG  TABS Patient with acute viral URI symptoms.  Positive at-home COVID test this week the symptoms starting late last week.  The symptoms seem relatively mild discussed r/b/a of antiviral medications.  Patient wishes to start.  Paxlovid sent to pharmacy.  Continue supportive care with OTC  cough/cold medications, Tylenol, rest, hydration, Flonase.  Given strict precautions.  Follow-up as needed   I discussed the assessment and treatment plan with the patient. The patient was provided an opportunity to ask questions and all were answered. The patient agreed with the plan and demonstrated an understanding of the instructions.   The patient was advised to call back or seek an in-person evaluation if the symptoms worsen or if the condition fails to improve as anticipated.   Kayla Saint, MD

## 2024-06-09 ENCOUNTER — Ambulatory Visit: Payer: Self-pay

## 2024-06-09 ENCOUNTER — Ambulatory Visit: Admitting: Family Medicine

## 2024-06-09 ENCOUNTER — Encounter: Payer: Self-pay | Admitting: Family Medicine

## 2024-06-09 VITALS — BP 110/78 | HR 61 | Temp 98.3°F | Wt 207.0 lb

## 2024-06-09 DIAGNOSIS — R300 Dysuria: Secondary | ICD-10-CM

## 2024-06-09 DIAGNOSIS — N390 Urinary tract infection, site not specified: Secondary | ICD-10-CM | POA: Diagnosis not present

## 2024-06-09 LAB — POC URINALSYSI DIPSTICK (AUTOMATED)
Bilirubin, UA: NEGATIVE
Blood, UA: NEGATIVE
Glucose, UA: NEGATIVE
Ketones, UA: NEGATIVE
Nitrite, UA: NEGATIVE
Protein, UA: NEGATIVE
Spec Grav, UA: 1.01 (ref 1.010–1.025)
Urobilinogen, UA: 0.2 U/dL
pH, UA: 6.5 (ref 5.0–8.0)

## 2024-06-09 MED ORDER — CIPROFLOXACIN HCL 500 MG PO TABS: 500.0000 mg | ORAL_TABLET | Freq: Two times a day (BID) | ORAL | 0 refills | Status: DC

## 2024-06-09 NOTE — Progress Notes (Signed)
   Subjective:    Patient ID: Kayla Mccall, female    DOB: 08-03-1976, 48 y.o.   MRN: [id number]  HPI Here for 10 days of urinary frequency and burning. Her urine is cloudy. No fever. She is drinking water and cranberry juice.    Review of Systems  Constitutional: Negative.   Respiratory: Negative.    Cardiovascular: Negative.   Gastrointestinal: Negative.   Genitourinary:  Positive for dysuria, frequency and urgency. Negative for flank pain and hematuria.       Objective:   Physical Exam Constitutional:      Appearance: Normal appearance.  Cardiovascular:     Rate and Rhythm: Normal rate and regular rhythm.     Pulses: Normal pulses.     Heart sounds: Normal heart sounds.  Pulmonary:     Effort: Pulmonary effort is normal.     Breath sounds: Normal breath sounds.  Abdominal:     Tenderness: There is no right CVA tenderness or left CVA tenderness.  Neurological:     Mental Status: She is alert.           Assessment & Plan:  UTI, treat with 7 days of Cipro . Culture the sample.  Garnette Olmsted, MD

## 2024-06-09 NOTE — Telephone Encounter (Signed)
 FYI Only or Action Required?: FYI only for provider.  Patient was last seen in primary care on 08/20/2023 by Mercer Clotilda SAUNDERS, MD.  Called Nurse Triage reporting Urinary Frequency.  Symptoms began several days ago.  Interventions attempted: OTC medications: AZO and Rest, hydration, or home remedies.  Symptoms are: unchanged.  Triage Disposition: See HCP Within 4 Hours (Or PCP Triage)  Patient/caregiver understands and will follow disposition?: Yes  Copied from CRM #[id number]. Topic: Clinical - Red Word Triage >> Jun 09, 2024  9:25 AM Robinson DEL wrote: Red Word that prompted transfer to Nurse Triage: Possible uti, last week frequent urination, Sunday right lower back pain, urine is cloudy and dark. Abdominal pain and throbbing pressure Reason for Disposition  Side (flank) or lower back pain present  Answer Assessment - Initial Assessment Questions 1. SYMPTOM: What's the main symptom you're concerned about? (e.g., frequency, incontinence)     Urinary frequency 2. ONSET: When did the  urinary frequency  start?     Started last week 3. PAIN: Is there any pain? If Yes, ask: How bad is it? (Scale: 1-10; mild, moderate, severe)     6-7 out of 10 4. CAUSE: What do you think is causing the symptoms?     Possible UTI 5. OTHER SYMPTOMS: Do you have any other symptoms? (e.g., blood in urine, fever, flank pain, pain with urination)     Urine is cloudy and dark, back pain  Protocols used: Urinary Symptoms-A-AH

## 2024-06-09 NOTE — Telephone Encounter (Signed)
 Spoke with the patient to schedule an appointment and she stated she is currently in the office.

## 2024-06-10 LAB — URINE CULTURE
MICRO NUMBER:: 16770455
SPECIMEN QUALITY:: ADEQUATE

## 2024-06-13 ENCOUNTER — Ambulatory Visit: Payer: Self-pay | Admitting: Family Medicine

## 2024-06-28 ENCOUNTER — Other Ambulatory Visit: Payer: Self-pay | Admitting: Obstetrics and Gynecology

## 2024-06-28 DIAGNOSIS — Z1231 Encounter for screening mammogram for malignant neoplasm of breast: Secondary | ICD-10-CM

## 2024-07-18 ENCOUNTER — Ambulatory Visit
Admission: RE | Admit: 2024-07-18 | Discharge: 2024-07-18 | Disposition: A | Discharge status: Home / Self Care | Source: Ambulatory Visit | Attending: Obstetrics and Gynecology | Admitting: Obstetrics and Gynecology

## 2024-07-18 DIAGNOSIS — Z1231 Encounter for screening mammogram for malignant neoplasm of breast: Secondary | ICD-10-CM

## 2024-07-28 LAB — HM PAP SMEAR: HM Pap smear: NEGATIVE

## 2024-08-31 DIAGNOSIS — J386 Stenosis of larynx: Secondary | ICD-10-CM

## 2024-08-31 HISTORY — DX: Stenosis of larynx: J38.6

## 2024-09-09 ENCOUNTER — Telehealth: Payer: Self-pay | Admitting: *Deleted

## 2024-09-09 NOTE — Telephone Encounter (Signed)
 Copied from CRM [id number]. Topic: Appointments - Scheduling Inquiry for Clinic >> Sep 09, 2024  4:11 PM Kayla Mccall wrote: Reason for CRM: Patient called in regarding wanting a full panel blood work, patient would like to be scheduled for a annual but would like the blood work to be scheduled first so she can talk about the blood work at the annual appointment would like a callback once the orders are in    ----------------------------------------------------------------------- From previous Reason for Contact - Scheduling: Patient/patient representative is calling to schedule an appointment. Refer to attachments for appointment information.

## 2024-09-13 ENCOUNTER — Telehealth: Payer: Self-pay | Admitting: *Deleted

## 2024-09-13 DIAGNOSIS — Z13220 Encounter for screening for lipoid disorders: Secondary | ICD-10-CM

## 2024-09-13 DIAGNOSIS — Z0000 Encounter for general adult medical examination without abnormal findings: Secondary | ICD-10-CM

## 2024-09-13 NOTE — Telephone Encounter (Signed)
 Duplicate ok to close

## 2024-09-13 NOTE — Telephone Encounter (Signed)
 I can order a lipid panel and aCBC/ CMP, however any further testing may not be covered because you need a diagnosis to order additional bloodwork and until the visit we do not have any documentation of other diagnoses. Order for these have been placed

## 2024-09-13 NOTE — Telephone Encounter (Unsigned)
 Copied from CRM [phone]. Topic: Appointments - Scheduling Inquiry for Clinic >> Sep 09, 2024  4:11 PM Thersia C wrote: Reason for CRM: Patient called in regarding wanting a full panel blood work, patient would like to be scheduled for a annual but would like the blood work to be scheduled first so she can talk about the blood work at the annual appointment would like a callback once the orders are in    ----------------------------------------------------------------------- From previous Reason for Contact - Scheduling: Patient/patient representative is calling to schedule an appointment. Refer to attachments for appointment information. >> Sep 13, 2024  1:23 PM Rea ORN wrote: Pt would like an update on this request for blood work. Please call back [phone]

## 2024-09-14 NOTE — Telephone Encounter (Signed)
 Left a detailed message with the information below at the patient's cell number.

## 2024-09-15 ENCOUNTER — Ambulatory Visit: Admitting: Family Medicine

## 2024-09-15 ENCOUNTER — Encounter: Payer: Self-pay | Admitting: Family Medicine

## 2024-09-15 VITALS — BP 118/68 | HR 65 | Temp 98.6°F | Ht 63.0 in | Wt 206.8 lb

## 2024-09-15 DIAGNOSIS — Z0000 Encounter for general adult medical examination without abnormal findings: Secondary | ICD-10-CM

## 2024-09-15 DIAGNOSIS — Z13220 Encounter for screening for lipoid disorders: Secondary | ICD-10-CM | POA: Diagnosis not present

## 2024-09-15 DIAGNOSIS — Z23 Encounter for immunization: Secondary | ICD-10-CM

## 2024-09-15 LAB — CBC WITH DIFFERENTIAL/PLATELET
Basophils Absolute: 0.1 K/uL (ref 0.0–0.1)
Basophils Relative: 0.7 % (ref 0.0–3.0)
Eosinophils Absolute: 0.1 K/uL (ref 0.0–0.7)
Eosinophils Relative: 0.9 % (ref 0.0–5.0)
HCT: 40.8 % (ref 36.0–46.0)
Hemoglobin: 13.8 g/dL (ref 12.0–15.0)
Lymphocytes Relative: 22.3 % (ref 12.0–46.0)
Lymphs Abs: 1.8 K/uL (ref 0.7–4.0)
MCHC: 33.9 g/dL (ref 30.0–36.0)
MCV: 91.8 fl (ref 78.0–100.0)
Monocytes Absolute: 0.7 K/uL (ref 0.1–1.0)
Monocytes Relative: 8.1 % (ref 3.0–12.0)
Neutro Abs: 5.5 K/uL (ref 1.4–7.7)
Neutrophils Relative %: 68 % (ref 43.0–77.0)
Platelets: 345 K/uL (ref 150.0–400.0)
RBC: 4.45 Mil/uL (ref 3.87–5.11)
RDW: 13.6 % (ref 11.5–15.5)
WBC: 8.1 K/uL (ref 4.0–10.5)

## 2024-09-15 LAB — LIPID PANEL
Cholesterol: 158 mg/dL (ref 0–200)
HDL: 73.5 mg/dL (ref >39.00)
LDL Cholesterol: 65 mg/dL (ref 0–99)
NonHDL: 84.83
Total CHOL/HDL Ratio: 2
Triglycerides: 99 mg/dL (ref 0.0–149.0)
VLDL: 19.8 mg/dL (ref 0.0–40.0)

## 2024-09-15 LAB — COMPREHENSIVE METABOLIC PANEL WITH GFR
ALT: 12 U/L (ref 0–35)
AST: 17 U/L (ref 0–37)
Albumin: 4.6 g/dL (ref 3.5–5.2)
Alkaline Phosphatase: 59 U/L (ref 39–117)
BUN: 13 mg/dL (ref 6–23)
CO2: 27 meq/L (ref 19–32)
Calcium: 9.4 mg/dL (ref 8.4–10.5)
Chloride: 103 meq/L (ref 96–112)
Creatinine, Ser: 0.77 mg/dL (ref 0.40–1.20)
GFR: 91.72 mL/min (ref >60.00)
Glucose, Bld: 78 mg/dL (ref 70–99)
Potassium: 4.1 meq/L (ref 3.5–5.1)
Sodium: 137 meq/L (ref 135–145)
Total Bilirubin: 0.3 mg/dL (ref 0.2–1.2)
Total Protein: 7.5 g/dL (ref 6.0–8.3)

## 2024-09-15 NOTE — Patient Instructions (Signed)

## 2024-09-15 NOTE — Progress Notes (Unsigned)
 Complete physical exam  Patient: Kayla Mccall   DOB: 1976-08-16   48 y.o. Female  MRN: [id number]  Subjective:    Chief Complaint  Patient presents with   Annual Exam    Kayla Mccall is a 48 y.o. female who presents today for a complete physical exam. She reports consuming a general diet. Does eat veggies  few days per week, eats protein almost daily, doesn't eat as much dairy due to the subglottic stenosis. Does eat snacks/chips, does't really drink sodas.  Home exercise routine includes walking 2 hrs per week. She generally feels well. She reports sleeping fairly well. She does not have additional problems to discuss today.   Pap was done 07/28/2024 and was negative NLIM and Neg for HPV. Pt is reporting premenopausal symptoms.   Most recent fall risk assessment:    08/20/2023   10:43 AM  Fall Risk   Falls in the past year? 0  Number falls in past yr: 0  Injury with Fall? 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   Multiple Vitamin (MULTI-VITAMIN) tablet Take 1 tablet by mouth daily.   Multiple Vitamins-Minerals (AIRBORNE) TBEF Take by mouth.   Naproxen Sodium (ALEVE) 220 MG CAPS as needed.   [DISCONTINUED] albuterol  (VENTOLIN  HFA) 108 (90 Base) MCG/ACT inhaler Inhale 2 puffs into the lungs every 6 (six) hours as needed.   [DISCONTINUED] cetirizine (ZYRTEC) 10 MG chewable tablet    [DISCONTINUED] cetirizine (ZYRTEC) 10 MG tablet Take by mouth.   [DISCONTINUED] ciprofloxacin  (CIPRO ) 500 MG tablet Take 1 tablet (500 mg total) by mouth 2 (two) times daily.   [DISCONTINUED] fluticasone  (FLONASE ) 50 MCG/ACT nasal spray Place into the nose.   No facility-administered medications prior to visit.    Review of Systems  HENT:  Negative for hearing loss.   Eyes:  Negative for blurred vision.  Respiratory:  Negative for shortness of breath.   Cardiovascular:  Negative for chest pain.  Gastrointestinal: Negative.   Genitourinary: Negative.   Musculoskeletal:  Negative for back pain.  Neurological:  Negative for headaches.  Psychiatric/Behavioral:  Negative for depression.   All other systems reviewed and are negative.      Objective:     BP 118/68   Pulse 65   Temp 98.6 F (37 C) (Oral)   Ht 5' 3 (1.6 m)   Wt 206 lb 12.8 oz (93.8  kg)   LMP 09/03/2024 (Exact Date)   SpO2 99%   BMI 36.63 kg/m  {Vitals History (Optional):23777}  Physical Exam Vitals reviewed.  Constitutional:      Appearance: Normal appearance. She is well-groomed and normal weight.  HENT:     Right Ear: Tympanic membrane and ear canal normal.     Left Ear: Tympanic membrane and ear canal normal.     Mouth/Throat:     Mouth: Mucous membranes are moist.     Pharynx: No posterior oropharyngeal erythema.  Eyes:     Conjunctiva/sclera: Conjunctivae normal.  Neck:     Thyroid : No thyromegaly.  Cardiovascular:     Rate and Rhythm: Normal rate and regular rhythm.     Pulses: Normal pulses.     Heart sounds: S1  normal and S2 normal.  Pulmonary:     Effort: Pulmonary effort is normal.     Breath sounds: Normal breath sounds and air entry.  Abdominal:     General: Abdomen is flat. Bowel sounds are normal.     Palpations: Abdomen is soft.  Musculoskeletal:     Right lower leg: No edema.     Left lower leg: No edema.  Lymphadenopathy:     Cervical: No cervical adenopathy.  Neurological:     Mental Status: She is alert and oriented to person, place, and time. Mental status is at baseline.     Gait: Gait is intact.  Psychiatric:        Mood and Affect: Mood and affect normal.        Speech: Speech normal.        Behavior: Behavior normal.        Judgment: Judgment normal.      Results for orders placed or performed in visit on 09/15/24  HM PAP SMEAR  Result Value Ref Range   HM Pap smear NLIM - HPV negative    {Show previous labs (optional):23779}    Assessment & Plan:    Routine Health Maintenance and Physical Exam  Immunization History  Administered Date(s) Administered   Influenza Inj Mdck Quad With Preservative 08/10/2018   Influenza,inj,Quad PF,6+ Mos 12/19/2016, 10/02/2021   PFIZER(Purple Top)SARS-COV-2 Vaccination 07/19/2020, 08/09/2020   Tdap 01/04/2013    Health Maintenance  Topic Date Due   Hepatitis C Screening  Never done   Pneumococcal Vaccine (1 of 2 - PCV) Never done   Hepatitis B Vaccines 19-59 Average Risk (1 of 3 - 19+ 3-dose series) Never done   DTaP/Tdap/Td (2 - Td or Tdap) 01/04/2023   COVID-19 Vaccine (3 - 2025-26 season) 07/11/2024   Influenza Vaccine  02/07/2025 (Originally 06/10/2024)   Mammogram  07/18/2026   Cervical Cancer Screening (HPV/Pap Cotest)  07/29/2027   Colonoscopy  04/10/2032   HIV Screening  Addressed   HPV VACCINES  Aged Out   Meningococcal B Vaccine  Aged Out    Discussed health benefits of physical activity, and encouraged her to engage in regular exercise appropriate for her age and condition.  Immunization due -     Tdap  vaccine greater than or equal to 7yo IM  Routine general medical examination at a health care facility  General physical exam findings are normal today. I reviewed the patient's preventative testing, immunizations, and lifestyle habits. I made appropriate recommendations and placed orders for the appropriate tests and/or vaccinations. I counseled the patient on the CDC's recommendations for healthy exercise and diet. I counseled the patient on healthy sleep habits and stress management. Handouts  to reinforce the counseling were given at the conclusion of the visit.    Return in 1 year (on 09/15/2025).     Heron CHRISTELLA Sharper, MD

## 2024-09-16 ENCOUNTER — Ambulatory Visit: Payer: Self-pay | Admitting: Family Medicine
